# Patient Record
Sex: Female | Born: 2000 | Hispanic: No | Marital: Single | Smoking: Never smoker
Health system: Southern US, Community
[De-identification: ages and names within clinical notes are randomized; demographics above are authoritative.]

## PROBLEM LIST (undated history)

## (undated) DIAGNOSIS — R519 Headache, unspecified: Secondary | ICD-10-CM

## (undated) DIAGNOSIS — F32A Depression, unspecified: Secondary | ICD-10-CM

## (undated) DIAGNOSIS — J302 Other seasonal allergic rhinitis: Secondary | ICD-10-CM

## (undated) DIAGNOSIS — R51 Headache: Secondary | ICD-10-CM

---
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
## 2018-02-11 DIAGNOSIS — H5203 Hypermetropia, bilateral: Secondary | ICD-10-CM | POA: Diagnosis not present

## 2018-05-18 DIAGNOSIS — R0609 Other forms of dyspnea: Secondary | ICD-10-CM | POA: Diagnosis not present

## 2018-05-18 DIAGNOSIS — N946 Dysmenorrhea, unspecified: Secondary | ICD-10-CM | POA: Diagnosis not present

## 2018-05-18 DIAGNOSIS — R5383 Other fatigue: Secondary | ICD-10-CM | POA: Diagnosis not present

## 2018-05-18 DIAGNOSIS — N920 Excessive and frequent menstruation with regular cycle: Secondary | ICD-10-CM | POA: Diagnosis not present

## 2019-06-02 DIAGNOSIS — J4520 Mild intermittent asthma, uncomplicated: Secondary | ICD-10-CM | POA: Diagnosis not present

## 2019-06-02 DIAGNOSIS — Z23 Encounter for immunization: Secondary | ICD-10-CM | POA: Diagnosis not present

## 2019-06-02 DIAGNOSIS — Z0000 Encounter for general adult medical examination without abnormal findings: Secondary | ICD-10-CM | POA: Diagnosis not present

## 2019-06-02 DIAGNOSIS — Z789 Other specified health status: Secondary | ICD-10-CM | POA: Diagnosis not present

## 2019-06-13 DIAGNOSIS — Z20828 Contact with and (suspected) exposure to other viral communicable diseases: Secondary | ICD-10-CM | POA: Diagnosis not present

## 2019-06-13 DIAGNOSIS — R05 Cough: Secondary | ICD-10-CM | POA: Diagnosis not present

## 2019-06-27 DIAGNOSIS — Z111 Encounter for screening for respiratory tuberculosis: Secondary | ICD-10-CM | POA: Diagnosis not present

## 2019-06-27 DIAGNOSIS — Z23 Encounter for immunization: Secondary | ICD-10-CM | POA: Diagnosis not present

## 2019-07-07 DIAGNOSIS — F411 Generalized anxiety disorder: Secondary | ICD-10-CM | POA: Diagnosis not present

## 2019-08-04 DIAGNOSIS — F411 Generalized anxiety disorder: Secondary | ICD-10-CM | POA: Diagnosis not present

## 2019-08-26 DIAGNOSIS — Z03818 Encounter for observation for suspected exposure to other biological agents ruled out: Secondary | ICD-10-CM | POA: Diagnosis not present

## 2019-08-26 DIAGNOSIS — Z20822 Contact with and (suspected) exposure to covid-19: Secondary | ICD-10-CM | POA: Diagnosis not present

## 2019-09-12 DIAGNOSIS — R110 Nausea: Secondary | ICD-10-CM | POA: Diagnosis not present

## 2019-09-12 DIAGNOSIS — F411 Generalized anxiety disorder: Secondary | ICD-10-CM | POA: Diagnosis not present

## 2019-09-27 DIAGNOSIS — J029 Acute pharyngitis, unspecified: Secondary | ICD-10-CM | POA: Diagnosis not present

## 2019-09-27 DIAGNOSIS — Z7951 Long term (current) use of inhaled steroids: Secondary | ICD-10-CM | POA: Diagnosis not present

## 2019-09-27 DIAGNOSIS — Z79899 Other long term (current) drug therapy: Secondary | ICD-10-CM | POA: Diagnosis not present

## 2019-09-27 DIAGNOSIS — Z20822 Contact with and (suspected) exposure to covid-19: Secondary | ICD-10-CM | POA: Diagnosis not present

## 2019-11-01 DIAGNOSIS — R079 Chest pain, unspecified: Secondary | ICD-10-CM | POA: Diagnosis not present

## 2019-11-01 DIAGNOSIS — U071 COVID-19: Secondary | ICD-10-CM | POA: Diagnosis not present

## 2019-11-01 DIAGNOSIS — Z20828 Contact with and (suspected) exposure to other viral communicable diseases: Secondary | ICD-10-CM | POA: Diagnosis not present

## 2019-11-01 DIAGNOSIS — R5383 Other fatigue: Secondary | ICD-10-CM | POA: Diagnosis not present

## 2019-11-03 DIAGNOSIS — R0602 Shortness of breath: Secondary | ICD-10-CM | POA: Diagnosis not present

## 2019-11-03 DIAGNOSIS — R05 Cough: Secondary | ICD-10-CM | POA: Diagnosis not present

## 2019-11-03 DIAGNOSIS — Z79899 Other long term (current) drug therapy: Secondary | ICD-10-CM | POA: Diagnosis not present

## 2019-11-03 DIAGNOSIS — R079 Chest pain, unspecified: Secondary | ICD-10-CM | POA: Diagnosis not present

## 2019-11-03 DIAGNOSIS — U071 COVID-19: Secondary | ICD-10-CM | POA: Diagnosis not present

## 2019-11-03 DIAGNOSIS — R0789 Other chest pain: Secondary | ICD-10-CM | POA: Diagnosis not present

## 2019-11-10 DIAGNOSIS — N3000 Acute cystitis without hematuria: Secondary | ICD-10-CM | POA: Diagnosis not present

## 2019-11-16 DIAGNOSIS — F321 Major depressive disorder, single episode, moderate: Secondary | ICD-10-CM | POA: Diagnosis not present

## 2019-11-16 DIAGNOSIS — F411 Generalized anxiety disorder: Secondary | ICD-10-CM | POA: Diagnosis not present

## 2020-01-19 DIAGNOSIS — F411 Generalized anxiety disorder: Secondary | ICD-10-CM | POA: Diagnosis not present

## 2020-01-19 DIAGNOSIS — F321 Major depressive disorder, single episode, moderate: Secondary | ICD-10-CM | POA: Diagnosis not present

## 2020-02-16 DIAGNOSIS — Z6826 Body mass index (BMI) 26.0-26.9, adult: Secondary | ICD-10-CM | POA: Diagnosis not present

## 2020-02-16 DIAGNOSIS — Z113 Encounter for screening for infections with a predominantly sexual mode of transmission: Secondary | ICD-10-CM | POA: Diagnosis not present

## 2020-02-16 DIAGNOSIS — Z118 Encounter for screening for other infectious and parasitic diseases: Secondary | ICD-10-CM | POA: Diagnosis not present

## 2020-02-16 DIAGNOSIS — Z01419 Encounter for gynecological examination (general) (routine) without abnormal findings: Secondary | ICD-10-CM | POA: Diagnosis not present

## 2020-02-17 DIAGNOSIS — F411 Generalized anxiety disorder: Secondary | ICD-10-CM | POA: Diagnosis not present

## 2020-02-17 DIAGNOSIS — F321 Major depressive disorder, single episode, moderate: Secondary | ICD-10-CM | POA: Diagnosis not present

## 2020-03-09 DIAGNOSIS — F411 Generalized anxiety disorder: Secondary | ICD-10-CM | POA: Diagnosis not present

## 2020-03-09 DIAGNOSIS — F321 Major depressive disorder, single episode, moderate: Secondary | ICD-10-CM | POA: Diagnosis not present

## 2020-03-13 DIAGNOSIS — Z23 Encounter for immunization: Secondary | ICD-10-CM | POA: Diagnosis not present

## 2020-04-06 DIAGNOSIS — Z79899 Other long term (current) drug therapy: Secondary | ICD-10-CM | POA: Diagnosis not present

## 2020-04-06 DIAGNOSIS — F329 Major depressive disorder, single episode, unspecified: Secondary | ICD-10-CM | POA: Diagnosis not present

## 2020-04-06 DIAGNOSIS — F419 Anxiety disorder, unspecified: Secondary | ICD-10-CM | POA: Diagnosis not present

## 2020-04-06 DIAGNOSIS — F4321 Adjustment disorder with depressed mood: Secondary | ICD-10-CM | POA: Diagnosis not present

## 2020-04-06 DIAGNOSIS — F331 Major depressive disorder, recurrent, moderate: Secondary | ICD-10-CM | POA: Diagnosis not present

## 2020-04-06 DIAGNOSIS — R45851 Suicidal ideations: Secondary | ICD-10-CM | POA: Diagnosis not present

## 2020-04-16 DIAGNOSIS — F321 Major depressive disorder, single episode, moderate: Secondary | ICD-10-CM | POA: Diagnosis not present

## 2020-04-16 DIAGNOSIS — F411 Generalized anxiety disorder: Secondary | ICD-10-CM | POA: Diagnosis not present

## 2020-04-17 DIAGNOSIS — F331 Major depressive disorder, recurrent, moderate: Secondary | ICD-10-CM | POA: Diagnosis not present

## 2020-04-17 DIAGNOSIS — F411 Generalized anxiety disorder: Secondary | ICD-10-CM | POA: Diagnosis not present

## 2020-04-30 DIAGNOSIS — F321 Major depressive disorder, single episode, moderate: Secondary | ICD-10-CM | POA: Diagnosis not present

## 2020-04-30 DIAGNOSIS — F411 Generalized anxiety disorder: Secondary | ICD-10-CM | POA: Diagnosis not present

## 2020-05-07 DIAGNOSIS — Z03818 Encounter for observation for suspected exposure to other biological agents ruled out: Secondary | ICD-10-CM | POA: Diagnosis not present

## 2020-05-07 DIAGNOSIS — Z20822 Contact with and (suspected) exposure to covid-19: Secondary | ICD-10-CM | POA: Diagnosis not present

## 2020-05-08 DIAGNOSIS — Z1339 Encounter for screening examination for other mental health and behavioral disorders: Secondary | ICD-10-CM | POA: Diagnosis not present

## 2020-05-08 DIAGNOSIS — F331 Major depressive disorder, recurrent, moderate: Secondary | ICD-10-CM | POA: Diagnosis not present

## 2020-05-24 DIAGNOSIS — Z111 Encounter for screening for respiratory tuberculosis: Secondary | ICD-10-CM | POA: Diagnosis not present

## 2020-05-24 DIAGNOSIS — Z30011 Encounter for initial prescription of contraceptive pills: Secondary | ICD-10-CM | POA: Diagnosis not present

## 2020-05-28 DIAGNOSIS — F331 Major depressive disorder, recurrent, moderate: Secondary | ICD-10-CM | POA: Diagnosis not present

## 2020-05-29 DIAGNOSIS — F331 Major depressive disorder, recurrent, moderate: Secondary | ICD-10-CM | POA: Diagnosis not present

## 2021-02-17 ENCOUNTER — Other Ambulatory Visit: Payer: Self-pay

## 2021-02-17 ENCOUNTER — Emergency Department
Admission: EM | Admit: 2021-02-17 | Discharge: 2021-02-17 | Disposition: A | Discharge status: Home / Self Care | Payer: BC Managed Care – PPO | Source: Home / Self Care

## 2021-02-17 DIAGNOSIS — U071 COVID-19: Secondary | ICD-10-CM

## 2021-02-17 HISTORY — DX: Depression, unspecified: F32.A

## 2021-02-17 LAB — POC SARS CORONAVIRUS 2 AG -  ED: SARS Coronavirus 2 Ag: POSITIVE — AB

## 2021-02-17 NOTE — ED Provider Notes (Signed)
[facility]    CSN: [id number] Arrival date & time: 02/17/21  1436      History   Chief Complaint Chief Complaint  Patient presents with   Nasal Congestion   Cough    HPI Jo Baker is a 20 y.o. female.   HPI old female presents with cough and nasal congestion x 3 days including intermittent shortness of breath and chest tightness.  Patient reports is [redacted] weeks pregnant and has recently take it COVID-19 home test which is negative.  Patient is vaccinated for COVID-19.  Past Medical History:  Diagnosis Date   Depression    Headache    Seasonal allergies     There are no problems to display for this patient.   History reviewed. No pertinent surgical history.  OB History     Gravida  1   Para      Term      Preterm      AB      Living         SAB      IAB      Ectopic      Multiple      Live Births               Home Medications    Prior to Admission medications   Medication Sig Start Date End Date Taking? Authorizing Provider  prenatal vitamin w/FE, FA (PRENATAL 1 + 1) 27-1 MG TABS tablet Take 1 tablet by mouth daily at 12 noon.   Yes [provider]  sertraline (ZOLOFT) 25 MG tablet Take 25 mg by mouth daily.   Yes [provider]  amitriptyline (ELAVIL) 10 MG tablet Take 10 mg by mouth at bedtime.    [provider]  meloxicam (MOBIC) 7.5 MG tablet Take one tab by mouth each morning with breakfast. 09/22/14   Lattie Haw, MD  ondansetron (ZOFRAN ODT) 4 MG disintegrating tablet Take one tab by mouth Q6hr prn nausea.  Dissolve under tongue. 08/10/16   Lattie Haw, MD    Family History Family History  Problem Relation Age of Onset   Healthy Mother    Hypertension Father    Hypertension Sister     Social History Social History   Tobacco Use   Smoking status: Never   Smokeless tobacco: Never  Vaping Use   Vaping Use: Never used  Substance Use Topics   Alcohol use: No    Drug use: No     Allergies   Patient has no known allergies.   Review of Systems Review of Systems  HENT:  Positive for congestion.   Respiratory:  Positive for cough, chest tightness and shortness of breath.   All other systems reviewed and are negative.   Physical Exam Triage Vital Signs ED Triage Vitals  Enc Vitals Group     BP 02/17/21 1512 119/80     Pulse Rate 02/17/21 1512 (!) 108     Resp 02/17/21 1512 20     Temp 02/17/21 1512 98.4 F (36.9 C)     Temp Source 02/17/21 1512 Oral     SpO2 02/17/21 1512 98 %     Weight 02/17/21 1508 187 lb (84.8 kg)     Height 02/17/21 1508 5\' 6"  (1.676 m)     Head Circumference --      Peak Flow --      Pain Score 02/17/21 1507 4     Pain Loc --  Pain Edu? --      Excl. in GC? --    No data found.  Updated Vital Signs BP 119/80   Pulse (!) 108   Temp 98.4 F (36.9 C) (Oral)   Resp 20   Ht 5\' 6"  (1.676 m)   Wt 187 lb (84.8 kg)   SpO2 98%   BMI 30.18 kg/m    Physical Exam Constitutional:      General: She is not in acute distress.    Appearance: Normal appearance. She is normal weight. She is not ill-appearing.  HENT:     Head: Normocephalic and atraumatic.     Right Ear: Tympanic membrane, ear canal and external ear normal.     Left Ear: Tympanic membrane, ear canal and external ear normal.     Nose: Nose normal.     Mouth/Throat:     Mouth: Mucous membranes are dry.     Pharynx: Oropharynx is clear.  Eyes:     Extraocular Movements: Extraocular movements intact.     Conjunctiva/sclera: Conjunctivae normal.     Pupils: Pupils are equal, round, and reactive to light.  Cardiovascular:     Rate and Rhythm: Normal rate and regular rhythm.     Pulses: Normal pulses.     Heart sounds: Normal heart sounds.  Pulmonary:     Effort: Pulmonary effort is normal.     Breath sounds: Normal breath sounds.     Comments: No adventitious breath sounds noted Musculoskeletal:        General: Normal range of motion.      Cervical back: Normal range of motion and neck supple. No tenderness.  Lymphadenopathy:     Cervical: No cervical adenopathy.  Skin:    General: Skin is warm and dry.  Neurological:     General: No focal deficit present.     Mental Status: She is alert and oriented to person, place, and time.  Psychiatric:        Mood and Affect: Mood normal.        Behavior: Behavior normal.        Thought Content: Thought content normal.     UC Treatments / Results  Labs (all labs ordered are listed, but only abnormal results are displayed) Labs Reviewed  POC SARS CORONAVIRUS 2 AG -  ED - Abnormal; Notable for the following components:      Result Value   SARS Coronavirus 2 Ag Positive (*)    All other components within normal limits    EKG   Radiology No results found.  Procedures Procedures (including critical care time)  Medications Ordered in UC Medications - No data to display  Initial Impression / Assessment and Plan / UC Course  I have reviewed the triage vital signs and the nursing notes.  Pertinent labs & imaging results that were available during my care of the patient were reviewed by me and considered in my medical decision making (see chart for details).     MDM: 1.  COVID-19-advised patient conservative measures for now, may use OTC Tylenol 1000 mg 1-2 times daily, as needed or OTC ibuprofen 600 to 800 mg 1-2 times daily, as needed for fever and/or myalgias, encourage patient increase daily water intake and to self quarantine for the next 10 days or 02/27/2021.  Patient discharged home, hemodynamically stable Final Clinical Impressions(s) / UC Diagnoses   Final diagnoses:  COVID-19     Discharge Instructions      Advised patient may use  OTC Tylenol 1000 mg and/or OTC ibuprofen 600 to 800 mg daily, as needed for fever, myalgias.  Encourage patient increase daily water intake and to quarantine for 10 days or, 02/27/2021.     ED Prescriptions   None     PDMP not reviewed this encounter.   Trevor Iha, FNP 02/17/21 1711

## 2021-02-17 NOTE — ED Triage Notes (Signed)
Pt presents to Urgent Care with c/o cough and nasal congestion x 3 days. States she has intermittent sob and chest tightness. Denies radiating pain; does report some nausea, but she is [redacted] weeks pregnant. Pt states she took a home COVID test 2 days ago which was negative; pt vaccinated. Reports feeling a "little light-headed" today in waiting room.

## 2021-02-17 NOTE — Discharge Instructions (Addendum)
Advised patient may use OTC Tylenol 1000 mg and/or OTC ibuprofen 600 to 800 mg daily, as needed for fever, myalgias.  Encourage patient increase daily water intake and to quarantine for 10 days or, 02/27/2021.

## 2021-05-25 DIAGNOSIS — F430 Acute stress reaction: Secondary | ICD-10-CM | POA: Insufficient documentation

## 2021-08-07 DIAGNOSIS — G43909 Migraine, unspecified, not intractable, without status migrainosus: Secondary | ICD-10-CM | POA: Diagnosis not present

## 2021-08-07 DIAGNOSIS — R110 Nausea: Secondary | ICD-10-CM | POA: Diagnosis not present

## 2021-09-07 DIAGNOSIS — Z20822 Contact with and (suspected) exposure to covid-19: Secondary | ICD-10-CM | POA: Diagnosis not present

## 2021-09-07 DIAGNOSIS — J069 Acute upper respiratory infection, unspecified: Secondary | ICD-10-CM | POA: Diagnosis not present

## 2021-12-10 ENCOUNTER — Ambulatory Visit
Admission: RE | Admit: 2021-12-10 | Discharge: 2021-12-10 | Disposition: A | Discharge status: Home / Self Care | Payer: 59 | Source: Ambulatory Visit | Attending: Student | Admitting: Student

## 2021-12-10 VITALS — BP 114/80 | HR 89 | Temp 98.6°F | Resp 16

## 2021-12-10 DIAGNOSIS — S161XXA Strain of muscle, fascia and tendon at neck level, initial encounter: Secondary | ICD-10-CM

## 2021-12-10 MED ORDER — TIZANIDINE HCL 4 MG PO TABS: 4.0000 mg | ORAL_TABLET | Freq: Four times a day (QID) | ORAL | 0 refills | Status: DC | PRN

## 2021-12-10 NOTE — ED Provider Notes (Signed)
?[facility] ? ? ? ?CSN: [id number] ?Arrival date & time: 12/10/21  0940 ? ? ?  ? ?History   ?Chief Complaint ?Chief Complaint  ?Patient presents with  ? Neck Injury  ?  Entered by patient  ? ? ?HPI ?Karolee Ohsnna K Slingerland is a 21 y.o. female presenting with right-sided neck pain following turning over in bed.  This occurred about 2 hours ago.  Medical history noncontributory, denies prior history of back/neck issues or trauma.  Patient states that she was laying in bed, she turned over to look at her fianc? when the right side of her neck seized up and she heard a pop.  Has attempted 400 mg of ibuprofen for the pain with minimal improvement.  States it is tolerable at rest, but with movement it is excruciating 10/10.  Denies new radiation of pain or numbness down the arms of the legs, denies lower back pain, denies headaches, denies vision changes. ? ?HPI ? ?Past Medical History:  ?Diagnosis Date  ? Depression   ? Headache   ? Seasonal allergies   ? ? ?There are no problems to display for this patient. ? ? ?Past Surgical History:  ?Procedure Laterality Date  ? CESAREAN SECTION    ? ? ?OB History   ? ? Gravida  ?1  ? Para  ?   ? Term  ?   ? Preterm  ?   ? AB  ?   ? Living  ?   ?  ? ? SAB  ?   ? IAB  ?   ? Ectopic  ?   ? Multiple  ?   ? Live Births  ?   ?   ?  ?  ? ? ? ?Home Medications   ? ?Prior to Admission medications   ?Medication Sig Start Date End Date Taking? Authorizing Provider  ?tiZANidine (ZANAFLEX) 4 MG tablet Take 1 tablet (4 mg total) by mouth every 6 (six) hours as needed for muscle spasms. 12/10/21  Yes Rhys MartiniGraham, [place] E, PA-C  ?amitriptyline (ELAVIL) 10 MG tablet Take 10 mg by mouth at bedtime.    [provider]  ?meloxicam (MOBIC) 7.5 MG tablet Take one tab by mouth each morning with breakfast. 09/22/14   Lattie HawBeese, Stephen A, MD  ?ondansetron (ZOFRAN ODT) 4 MG disintegrating tablet Take one tab by mouth Q6hr prn nausea.  Dissolve under tongue. 08/10/16   Lattie HawBeese, Stephen A, MD  ?prenatal vitamin  w/FE, FA (PRENATAL 1 + 1) 27-1 MG TABS tablet Take 1 tablet by mouth daily at 12 noon.    [provider]  ?sertraline (ZOLOFT) 25 MG tablet Take 25 mg by mouth daily.    [provider]  ? ? ?Family History ?Family History  ?Problem Relation Age of Onset  ? Healthy Mother   ? Hypertension Father   ? Hypertension Sister   ? ? ?Social History ?Social History  ? ?Tobacco Use  ? Smoking status: Never  ? Smokeless tobacco: Never  ?Vaping Use  ? Vaping Use: Never used  ?Substance Use Topics  ? Alcohol use: No  ? Drug use: No  ? ? ? ?Allergies   ?Patient has no known allergies. ? ? ?Review of Systems ?Review of Systems  ?Musculoskeletal:  Positive for neck pain.  ?All other systems reviewed and are negative. ? ? ?Physical Exam ?Triage Vital Signs ?ED Triage Vitals  ?Enc Vitals Group  ?   BP 12/10/21 1005 114/80  ?   Pulse Rate 12/10/21  1005 89  ?   Resp 12/10/21 1005 16  ?   Temp 12/10/21 1005 98.6 ?F (37 ?C)  ?   Temp Source 12/10/21 1005 Oral  ?   SpO2 12/10/21 1005 96 %  ?   Weight --   ?   Height --   ?   Head Circumference --   ?   Peak Flow --   ?   Pain Score 12/10/21 1008 10  ?   Pain Loc --   ?   Pain Edu? --   ?   Excl. in GC? --   ? ?No data found. ? ?Updated Vital Signs ?BP 114/80 (BP Location: Left Arm)   Pulse 89   Temp 98.6 ?F (37 ?C) (Oral)   Resp 16   LMP 12/08/2021   SpO2 96%   Breastfeeding No  ? ?Visual Acuity ?Right Eye Distance:   ?Left Eye Distance:   ?Bilateral Distance:   ? ?Right Eye Near:   ?Left Eye Near:    ?Bilateral Near:    ? ?Physical Exam ?Vitals reviewed.  ?Constitutional:   ?   General: She is not in acute distress. ?   Appearance: Normal appearance. She is not ill-appearing.  ?HENT:  ?   Head: Normocephalic and atraumatic.  ?Neck:  ?   Comments: See MSK ?Cardiovascular:  ?   Rate and Rhythm: Normal rate and regular rhythm.  ?   Heart sounds: Normal heart sounds.  ?Pulmonary:  ?   Effort: Pulmonary effort is normal.  ?   Breath sounds: Normal breath sounds and  air entry.  ?Abdominal:  ?   Tenderness: There is no abdominal tenderness. There is no right CVA tenderness, left CVA tenderness, guarding or rebound.  ?Musculoskeletal:  ?   Cervical back: Normal range of motion. Spasms and tenderness present. No swelling, deformity, signs of trauma, rigidity, bony tenderness or crepitus. Pain with movement and muscular tenderness present.  ?   Thoracic back: No swelling, deformity, signs of trauma, spasms, tenderness or bony tenderness. Normal range of motion. No scoliosis.  ?   Lumbar back: No swelling, deformity, signs of trauma, spasms, tenderness or bony tenderness. Normal range of motion. Negative right straight leg raise test and negative left straight leg raise test. No scoliosis.  ?   Comments: Right-sided cervical paraspinous muscle tenderness and hypertonicity to palpation.  Pain elicited with flexion and extension cervical spine. No midline spinous tenderness, deformity, stepoff.  Strength grossly intact upper and lower extremities, sensation intact.  Negative Spurling bilaterally.  Grip strength 5/5. ? ?Absolutely no other injury, deformity, tenderness, ecchymosis, abrasion.  ?Neurological:  ?   General: No focal deficit present.  ?   Mental Status: She is alert.  ?   Cranial Nerves: No cranial nerve deficit.  ?Psychiatric:     ?   Mood and Affect: Mood normal.     ?   Behavior: Behavior normal.     ?   Thought Content: Thought content normal.     ?   Judgment: Judgment normal.  ? ? ? ?UC Treatments / Results  ?Labs ?(all labs ordered are listed, but only abnormal results are displayed) ?Labs Reviewed - No data to display ? ?EKG ? ? ?Radiology ?No results found. ? ?Procedures ?Procedures (including critical care time) ? ?Medications Ordered in UC ?Medications - No data to display ? ?Initial Impression / Assessment and Plan / UC Course  ?I have reviewed the triage vital signs and the nursing notes. ? ?  Pertinent labs & imaging results that were available during my care  of the patient were reviewed by me and considered in my medical decision making (see chart for details). ? ?  ? ?This patient is a very pleasant 21 y.o. year old female presenting with cervical strain following turning over in bed this morning. No red flag symptoms. As there is no midline spinous tenderness or deformity, and no radicular symptoms, did not check an xray.  ? ?Zanaflex, heating pad, continue ibuprofen. As she took 400mg  ibuprofen immediately before her visit, did not administer additional during visit.  ? ?Work note provided. ED return precautions discussed. Patient verbalizes understanding and agreement.  ? ? ?Final Clinical Impressions(s) / UC Diagnoses  ? ?Final diagnoses:  ?Strain of neck muscle, initial encounter  ? ? ? ?Discharge Instructions   ? ?  ?-Start the muscle relaxer-Zanaflex (tizanidine) as needed for muscle spasms and pain.  This can make you drowsy, so take at bedtime or when you do not need to drive or operate machinery. ?-You can take Tylenol up to 1000 mg 3 times daily, and ibuprofen up to 600 mg 3 times daily with food.  You can take these together, or alternate every 3-4 hours. ?-Heating pad ?-Gentle stretching  ? ? ? ?ED Prescriptions   ? ? Medication Sig Dispense Auth. Provider  ? tiZANidine (ZANAFLEX) 4 MG tablet Take 1 tablet (4 mg total) by mouth every 6 (six) hours as needed for muscle spasms. 30 tablet , PA-C  ? ?  ? ?PDMP not reviewed this encounter. ?  ?Rhys Martini, PA-C ?12/10/21 1107 ? ?

## 2021-12-10 NOTE — ED Triage Notes (Signed)
Patient presents to Urgent Care with complaints of neck pain this morning. She states she was turning her head and heard a pop sound. Treating pain with ibuprofen.   ?

## 2021-12-10 NOTE — Discharge Instructions (Addendum)
-  Start the muscle relaxer-Zanaflex (tizanidine) as needed for muscle spasms and pain.  This can make you drowsy, so take at bedtime or when you do not need to drive or operate machinery. ?-You can take Tylenol up to 1000 mg 3 times daily, and ibuprofen up to 600 mg 3 times daily with food.  You can take these together, or alternate every 3-4 hours. ?-Heating pad ?-Gentle stretching  ? ?

## 2021-12-28 ENCOUNTER — Other Ambulatory Visit: Payer: Self-pay

## 2021-12-28 ENCOUNTER — Emergency Department: Payer: 59

## 2021-12-28 ENCOUNTER — Encounter: Payer: Self-pay | Admitting: Emergency Medicine

## 2021-12-28 ENCOUNTER — Emergency Department
Admission: EM | Admit: 2021-12-28 | Discharge: 2021-12-28 | Disposition: A | Discharge status: Home / Self Care | Payer: 59 | Attending: Emergency Medicine | Admitting: Emergency Medicine

## 2021-12-28 DIAGNOSIS — R062 Wheezing: Secondary | ICD-10-CM | POA: Diagnosis present

## 2021-12-28 DIAGNOSIS — J4531 Mild persistent asthma with (acute) exacerbation: Secondary | ICD-10-CM | POA: Insufficient documentation

## 2021-12-28 MED ORDER — PREDNISONE 10 MG (21) PO TBPK: ORAL_TABLET | ORAL | 0 refills | Status: DC

## 2021-12-28 NOTE — ED Triage Notes (Signed)
  Patient comes in with asthma exacerbation.  Patient states she went to the gym around 1200 this morning and started having asthma attack before working out.  Patient took 3 puffs from albuterol inhaler and felt better.  Patient came home and had another asthma attack around 1900 while sitting on couch.  Patient again took 3 puffs from albuterol MDI and felt better but wanted to be checked out.  Patient endorses tightness under bilateral breasts and difficulty breathing.  Bilateral lung sounds clear and no wheezing.  SPO2 100% on RA and patient in no distress.  Pain 6/10.

## 2021-12-28 NOTE — Discharge Instructions (Signed)
Take tapered steroid if wheezing returns tomorrow.

## 2021-12-28 NOTE — ED Provider Notes (Signed)
[facility] Provider Note  Patient Contact: 9:44 PM (approximate)   History   Asthma   HPI  Jo Baker is a 21 y.o. female presents to the emergency department with intermittent wheezing that occurred today.  Patient states that for wheezing seemed to resolve.  Patient states that she was typically have an asthma exacerbation and wanted be checked out.  She denies current wheezing and denies current chest tightness or chest pain.      Physical Exam   Triage Vital Signs: ED Triage Vitals  Enc Vitals Group     BP 12/28/21 2031 134/85     Pulse Rate 12/28/21 2031 75     Resp 12/28/21 2031 18     Temp 12/28/21 2031 98.7 F (37.1 C)     Temp Source 12/28/21 2031 Oral     SpO2 12/28/21 2031 100 %     Weight 12/28/21 2032 200 lb (90.7 kg)     Height 12/28/21 2032 5\' 6"  (1.676 m)     Head Circumference --      Peak Flow --      Pain Score 12/28/21 2031 6     Pain Loc --      Pain Edu? --      Excl. in GC? --     Most recent vital signs: Vitals:   12/28/21 2031  BP: 134/85  Pulse: 75  Resp: 18  Temp: 98.7 F (37.1 C)  SpO2: 100%     General: Alert and in no acute distress. Eyes:  PERRL. EOMI. Head: No acute traumatic findings ENT:      Ears: Tms are pearly.       Nose: No congestion/rhinnorhea.      Mouth/Throat: Mucous membranes are moist.  Neck: No stridor. No cervical spine tenderness to palpation. Cardiovascular:  Good peripheral perfusion Respiratory: Normal respiratory effort without tachypnea or retractions. Lungs CTAB. Good air entry to the bases with no decreased or absent breath sounds. Gastrointestinal: Bowel sounds 4 quadrants. Soft and nontender to palpation. No guarding or rigidity. No palpable masses. No distention. No CVA tenderness. Musculoskeletal: Full range of motion to all extremities.  Neurologic:  No gross focal neurologic deficits are appreciated.  Skin:   No rash noted Other:   ED Results / Procedures  / Treatments   Labs (all labs ordered are listed, but only abnormal results are displayed) Labs Reviewed - No data to display      RADIOLOGY  I personally viewed and evaluated these images as part of my medical decision making, as well as reviewing the written report by the radiologist.  ED Provider Interpretation: I personally interpreted chest x-ray and there were no consolidations, opacities or infiltrates to suggest pneumonia.   PROCEDURES:  Critical Care performed: No  Procedures   MEDICATIONS ORDERED IN ED: Medications - No data to display   IMPRESSION / MDM / ASSESSMENT AND PLAN / ED COURSE  I reviewed the triage vital signs and the nursing notes.                             Assessment and plan Wheezing 21 year old female presents to the emergency department with intermittent episodes of wheezing that started today.  Vital signs are reassuring at triage.  On exam, patient was alert, active and nontoxic-appearing.  Chest x-ray is reassuring.  Will prescribe patient tapered prednisone to use if wheezing returns tomorrow.  Return precautions were  given to return with new or worsening symptoms.      FINAL CLINICAL IMPRESSION(S) / ED DIAGNOSES   Final diagnoses:  Mild persistent asthma with exacerbation     Rx / DC Orders   ED Discharge Orders          Ordered    predniSONE (STERAPRED UNI-PAK 21 TAB) 10 MG (21) TBPK tablet        12/28/21 2139             Note:  This document was prepared using Dragon voice recognition software and may include unintentional dictation errors.   Pia Mau [place], Cordelia Poche 12/28/21 2147    Minna Antis, MD 12/29/21 Windell Moment

## 2022-02-25 ENCOUNTER — Ambulatory Visit
Admission: RE | Admit: 2022-02-25 | Discharge: 2022-02-25 | Disposition: A | Discharge status: Home / Self Care | Payer: 59 | Source: Ambulatory Visit | Attending: Emergency Medicine | Admitting: Emergency Medicine

## 2022-02-25 VITALS — BP 112/75 | HR 74 | Temp 98.4°F | Resp 20

## 2022-02-25 DIAGNOSIS — R002 Palpitations: Secondary | ICD-10-CM | POA: Diagnosis not present

## 2022-02-25 DIAGNOSIS — R42 Dizziness and giddiness: Secondary | ICD-10-CM

## 2022-02-25 MED ORDER — MECLIZINE HCL 12.5 MG PO TABS: 12.5000 mg | ORAL_TABLET | Freq: Three times a day (TID) | ORAL | 0 refills | Status: DC | PRN

## 2022-02-25 NOTE — ED Triage Notes (Signed)
Pt here with intermittent dizziness when moving head a certain way over the last few months.

## 2022-02-25 NOTE — ED Provider Notes (Signed)
Renaldo Fiddler    CSN: [id number] Arrival date & time: 02/25/22  1457      History   Chief Complaint Chief Complaint  Patient presents with   Dizziness    Entered by patient    HPI Jo Baker is a 21 y.o. female.  Patient presents with intermittent episodes of "dizziness" for several months; worse for the past couple of weeks.  The dizziness feels like spinning and sometimes like the room is spinning.  It is worse when she is lying down, changes position, turns her head.  The episodes resolve spontaneously.  No alleviating factors identified.  She reports occasionally feeling heart palpitations also.  No current dizziness, chest pain, heart palpitations.  No other associated symptoms.  No focal weakness, headache, shortness of breath, chest pain, or other symptoms.  She has attempted treatment by increasing her water intake.  She has not seen her PCP for her symptoms.  She states she recently moved to the area and does not have a local PCP yet.  Her medical history includes asthma, migraine headaches, anxiety, depression, stress reaction, suicidal thoughts, anemia.  LMP: current.  She denies current pregnancy or breastfeeding.    The history is provided by the patient and medical records.    Past Medical History:  Diagnosis Date   Depression    Headache    Seasonal allergies     There are no problems to display for this patient.   Past Surgical History:  Procedure Laterality Date   CESAREAN SECTION      OB History     Gravida  1   Para      Term      Preterm      AB      Living         SAB      IAB      Ectopic      Multiple      Live Births               Home Medications    Prior to Admission medications   Medication Sig Start Date End Date Taking? Authorizing Provider  meclizine (ANTIVERT) 12.5 MG tablet Take 1 tablet (12.5 mg total) by mouth 3 (three) times daily as needed for dizziness. 02/25/22  Yes Mickie Bail, NP   amitriptyline (ELAVIL) 10 MG tablet Take 10 mg by mouth at bedtime.    [provider]  meloxicam (MOBIC) 7.5 MG tablet Take one tab by mouth each morning with breakfast. 09/22/14   Lattie Haw, MD  ondansetron (ZOFRAN ODT) 4 MG disintegrating tablet Take one tab by mouth Q6hr prn nausea.  Dissolve under tongue. 08/10/16   Lattie Haw, MD  predniSONE (STERAPRED UNI-PAK 21 TAB) 10 MG (21) TBPK tablet Take 6 tablets the first day, take 5 tablets the second day, take 4 tablets the third day, take 3 tablets the fourth day, take 2 tablets the fifth day, take 1 tablet the sixth day. 12/28/21   Orvil Feil, PA-C  prenatal vitamin w/FE, FA (PRENATAL 1 + 1) 27-1 MG TABS tablet Take 1 tablet by mouth daily at 12 noon.    [provider]  sertraline (ZOLOFT) 25 MG tablet Take 25 mg by mouth daily.    [provider]  tiZANidine (ZANAFLEX) 4 MG tablet Take 1 tablet (4 mg total) by mouth every 6 (six) hours as needed for muscle spasms. 12/10/21   Rhys Martini, PA-C  Family History Family History  Problem Relation Age of Onset   Healthy Mother    Hypertension Father    Hypertension Sister     Social History Social History   Tobacco Use   Smoking status: Never   Smokeless tobacco: Never  Vaping Use   Vaping Use: Never used  Substance Use Topics   Alcohol use: No   Drug use: No     Allergies   Patient has no known allergies.   Review of Systems Review of Systems  Constitutional:  Negative for chills and fever.  Respiratory:  Negative for cough and shortness of breath.   Cardiovascular:  Positive for palpitations. Negative for chest pain.  Gastrointestinal:  Negative for abdominal pain, nausea and vomiting.  Neurological:  Positive for dizziness and light-headedness. Negative for syncope, facial asymmetry, speech difficulty, weakness, numbness and headaches.  All other systems reviewed and are negative.    Physical Exam Triage Vital Signs ED  Triage Vitals  Enc Vitals Group     BP 02/25/22 1508 112/75     Pulse Rate 02/25/22 1508 74     Resp 02/25/22 1508 20     Temp 02/25/22 1508 98.4 F (36.9 C)     Temp src --      SpO2 02/25/22 1508 99 %     Weight --      Height --      Head Circumference --      Peak Flow --      Pain Score 02/25/22 1509 0     Pain Loc --      Pain Edu? --      Excl. in GC? --    No data found.  Updated Vital Signs BP 112/75   Pulse 74   Temp 98.4 F (36.9 C)   Resp 20   SpO2 99%   Visual Acuity Right Eye Distance:   Left Eye Distance:   Bilateral Distance:    Right Eye Near:   Left Eye Near:    Bilateral Near:     Physical Exam Vitals and nursing note reviewed.  Constitutional:      General: She is not in acute distress.    Appearance: Normal appearance. She is well-developed. She is not ill-appearing.  HENT:     Head: Normocephalic and atraumatic.     Mouth/Throat:     Mouth: Mucous membranes are moist.  Eyes:     Extraocular Movements: Extraocular movements intact.     Pupils: Pupils are equal, round, and reactive to light.  Cardiovascular:     Rate and Rhythm: Normal rate and regular rhythm.     Heart sounds: Normal heart sounds.  Pulmonary:     Effort: Pulmonary effort is normal. No respiratory distress.     Breath sounds: Normal breath sounds.  Abdominal:     Palpations: Abdomen is soft.     Tenderness: There is no abdominal tenderness.  Musculoskeletal:     Cervical back: Neck supple.  Skin:    General: Skin is warm and dry.  Neurological:     General: No focal deficit present.     Mental Status: She is alert and oriented to person, place, and time.     Cranial Nerves: No cranial nerve deficit.     Sensory: No sensory deficit.     Motor: No weakness.     Gait: Gait normal.  Psychiatric:        Mood and Affect: Mood normal.  Behavior: Behavior normal.      UC Treatments / Results  Labs (all labs ordered are listed, but only abnormal results  are displayed) Labs Reviewed - No data to display  EKG   Radiology No results found.  Procedures Procedures (including critical care time)  Medications Ordered in UC Medications - No data to display  Initial Impression / Assessment and Plan / UC Course  I have reviewed the triage vital signs and the nursing notes.  Pertinent labs & imaging results that were available during my care of the patient were reviewed by me and considered in my medical decision making (see chart for details).  Dizziness, heart palpitations.  Patient is well-appearing and her exam is reassuring.  Vital signs are stable.  EKG shows sinus rhythm, rate 80, no ST elevation, compared to previous from 12/31/2021.  Treating symptoms with meclizine and precautions for drowsiness with this medication discussed.  Education provided on dizziness and heart palpitations.  ED precautions discussed.  Instructed patient to establish a local PCP as soon as possible and [place] assistance with this requested.  Patient agrees to plan of care.  Final Clinical Impressions(s) / UC Diagnoses   Final diagnoses:  Dizziness  Heart palpitations     Discharge Instructions      Take the meclizine as directed.  Do not drive, operate machinery, or drink alcohol with this medication as it may cause drowsiness.   Go to the emergency department if you have worsening symptoms.    Establish a primary care provider as soon as possible.  [place] assistance with this has been requested.        ED Prescriptions     Medication Sig Dispense Auth. Provider   meclizine (ANTIVERT) 12.5 MG tablet Take 1 tablet (12.5 mg total) by mouth 3 (three) times daily as needed for dizziness. 30 tablet Mickie Bail, NP      I have reviewed the PDMP during this encounter.   Mickie Bail, NP 02/25/22 [phone]

## 2022-02-25 NOTE — Discharge Instructions (Addendum)
Take the meclizine as directed.  Do not drive, operate machinery, or drink alcohol with this medication as it may cause drowsiness.   Go to the emergency department if you have worsening symptoms.    Establish a primary care provider as soon as possible.  [place] assistance with this has been requested.

## 2022-02-27 ENCOUNTER — Encounter: Payer: Self-pay | Admitting: Emergency Medicine

## 2022-03-04 ENCOUNTER — Encounter: Payer: Self-pay | Admitting: Family Medicine

## 2022-03-18 ENCOUNTER — Ambulatory Visit: Payer: 59 | Admitting: Family Medicine

## 2022-11-13 DIAGNOSIS — G43809 Other migraine, not intractable, without status migrainosus: Secondary | ICD-10-CM | POA: Diagnosis not present

## 2023-02-26 ENCOUNTER — Encounter: Payer: Self-pay | Admitting: Family Medicine

## 2023-02-26 ENCOUNTER — Ambulatory Visit: Payer: Commercial Managed Care - PPO | Admitting: Family Medicine

## 2023-02-26 VITALS — BP 91/65 | HR 98 | Temp 98.1°F | Resp 20 | Ht 66.0 in | Wt 193.5 lb

## 2023-02-26 DIAGNOSIS — Z13220 Encounter for screening for lipoid disorders: Secondary | ICD-10-CM | POA: Diagnosis not present

## 2023-02-26 DIAGNOSIS — Z136 Encounter for screening for cardiovascular disorders: Secondary | ICD-10-CM | POA: Diagnosis not present

## 2023-02-26 DIAGNOSIS — Z1159 Encounter for screening for other viral diseases: Secondary | ICD-10-CM | POA: Diagnosis not present

## 2023-02-26 DIAGNOSIS — F411 Generalized anxiety disorder: Secondary | ICD-10-CM | POA: Diagnosis not present

## 2023-02-26 DIAGNOSIS — F331 Major depressive disorder, recurrent, moderate: Secondary | ICD-10-CM | POA: Diagnosis not present

## 2023-02-26 DIAGNOSIS — Z0000 Encounter for general adult medical examination without abnormal findings: Secondary | ICD-10-CM

## 2023-02-26 DIAGNOSIS — R7302 Impaired glucose tolerance (oral): Secondary | ICD-10-CM | POA: Diagnosis not present

## 2023-02-26 DIAGNOSIS — Z7689 Persons encountering health services in other specified circumstances: Secondary | ICD-10-CM

## 2023-02-26 NOTE — Progress Notes (Signed)
Complete physical exam and establish care  Patient: Jo Baker   DOB: [birth year]   22 y.o. Female  MRN: [id number]  Subjective:    Chief Complaint  Patient presents with   Establish Care    Patient is here to establish care with new PCP, She would like a physical without a pap smear, patient states her last pap was done two years ago at [facility] Jo Baker is a 22 y.o. female who presents today for a complete physical exam. She reports consuming a general diet.  Walking during work and goes to the gym once a week  She generally feels well. She reports sleeping well. She does not have additional problems to discuss today.   Last pap at [facility] in 2021.  She takes Wellbutrin 300mg  daily for anxiety/depression. She has psychiatrist in [place]. She is also taking hydroxyzine 25mg  po TID prn for anxiety.  Pt declines HPV vaccine.  Most recent fall risk assessment:    02/26/2023    1:26 PM  Fall Risk   Falls in the past year? 0  Number falls in past yr: 0  Injury with Fall? 0  Risk for fall due to : No Fall Risks  Follow up Falls evaluation completed     Most recent depression screenings:    02/26/2023    1:26 PM  PHQ 2/9 Scores  PHQ - 2 Score 0  PHQ- 9 Score 3    Vision:Within last year  Patient Active Problem List   Diagnosis Date Noted   GAD (generalized anxiety disorder) 04/17/2020   Moderate episode of recurrent major depressive disorder (HCC) 04/17/2020   Chronic non-seasonal allergic rhinitis 10/21/2016   Mild intermittent asthma without complication 10/21/2016   Past Medical History:  Diagnosis Date   Depression    Headache    Seasonal allergies    Past Surgical History:  Procedure Laterality Date   CESAREAN SECTION  2022   Social History   Tobacco Use   Smoking status: Never    Passive exposure: Never   Smokeless tobacco: Never  Vaping Use   Vaping status: Never Used  Substance Use Topics   Alcohol use: Never   Drug use:  Never   Social History   Socioeconomic History   Marital status: Single    Spouse name: Not on file   Number of children: 2   Years of education: Not on file   Highest education level: Not on file  Occupational History   Occupation: Conservation officer, historic buildings  Tobacco Use   Smoking status: Never    Passive exposure: Never   Smokeless tobacco: Never  Vaping Use   Vaping status: Never Used  Substance and Sexual Activity   Alcohol use: Never   Drug use: Never   Sexual activity: Not on file  Other Topics Concern   Not on file  Social History Narrative   Not on file   Social Determinants of Health   Financial Resource Strain: Not on file  Food Insecurity: No Food Insecurity (05/23/2021)   Received from [facility], [facility]   Hunger Vital Sign    Worried About Running Out of Food in the Last Year: Never true    Ran Out of Food in the Last Year: Never true  Transportation Needs: Not on file  Physical Activity: Not on file  Stress: Not on file  Social Connections: Unknown (12/14/2021)   Received from [facility], [facility]   Social  Network    Social Network: Not on file  Intimate Partner Violence: Unknown (11/06/2021)   Received from [facility], [facility]   HITS    Physically Hurt: Not on file    Insult or Talk Down To: Not on file    Threaten Physical Harm: Not on file    Scream or Curse: Not on file   Family Status  Relation Name Status   Mother  Alive   Father  Alive   Sister  (Not Specified)  No partnership data on file   Family History  Problem Relation Age of Onset   Healthy Mother    Hypertension Father    Hypertension Sister    No Known Allergies    Patient Care Team: [facility] as PCP - General   Outpatient Medications Prior to Visit  Medication Sig   buPROPion (WELLBUTRIN XL) 300 MG 24 hr tablet Take 300 mg by mouth daily.   hydrOXYzine (ATARAX) 25 MG tablet Take 25 mg by mouth every 8 (eight) hours as  needed for anxiety.   [DISCONTINUED] amitriptyline (ELAVIL) 10 MG tablet Take 10 mg by mouth at bedtime. (Patient not taking: Reported on 02/26/2023)   [DISCONTINUED] meclizine (ANTIVERT) 12.5 MG tablet Take 1 tablet (12.5 mg total) by mouth 3 (three) times daily as needed for dizziness. (Patient not taking: Reported on 02/26/2023)   [DISCONTINUED] meloxicam (MOBIC) 7.5 MG tablet Take one tab by mouth each morning with breakfast. (Patient not taking: Reported on 02/26/2023)   [DISCONTINUED] ondansetron (ZOFRAN ODT) 4 MG disintegrating tablet Take one tab by mouth Q6hr prn nausea.  Dissolve under tongue. (Patient not taking: Reported on 02/26/2023)   [DISCONTINUED] predniSONE (STERAPRED UNI-PAK 21 TAB) 10 MG (21) TBPK tablet Take 6 tablets the first day, take 5 tablets the second day, take 4 tablets the third day, take 3 tablets the fourth day, take 2 tablets the fifth day, take 1 tablet the sixth day.   [DISCONTINUED] prenatal vitamin w/FE, FA (PRENATAL 1 + 1) 27-1 MG TABS tablet Take 1 tablet by mouth daily at 12 noon.   [DISCONTINUED] sertraline (ZOLOFT) 25 MG tablet Take 25 mg by mouth daily. (Patient not taking: Reported on 02/26/2023)   [DISCONTINUED] tiZANidine (ZANAFLEX) 4 MG tablet Take 1 tablet (4 mg total) by mouth every 6 (six) hours as needed for muscle spasms. (Patient not taking: Reported on 02/26/2023)   No facility-administered medications prior to visit.    Review of Systems  All other systems reviewed and are negative.         Objective:     BP 91/65   Pulse 98   Temp 98.1 F (36.7 C) (Oral)   Resp 20   Ht 5\' 6"  (1.676 m)   Wt 193 lb 8 oz (87.8 kg)   LMP 02/23/2023 (Approximate)   SpO2 98%   BMI 31.23 kg/m  BP Readings from Last 3 Encounters:  02/26/23 91/65  02/25/22 112/75  12/28/21 129/79      Physical Exam Vitals and nursing note reviewed.  Constitutional:      Appearance: Normal appearance. She is normal weight.  HENT:     Head: Normocephalic and  atraumatic.     Right Ear: Tympanic membrane, ear canal and external ear normal.     Left Ear: Tympanic membrane, ear canal and external ear normal.     Nose: Nose normal.     Mouth/Throat:     Mouth: Mucous membranes are moist.     Pharynx: Oropharynx  is clear.  Eyes:     Conjunctiva/sclera: Conjunctivae normal.     Pupils: Pupils are equal, round, and reactive to light.  Cardiovascular:     Rate and Rhythm: Normal rate and regular rhythm.     Pulses: Normal pulses.     Heart sounds: Normal heart sounds.  Pulmonary:     Effort: Pulmonary effort is normal.     Breath sounds: Normal breath sounds.  Abdominal:     General: Abdomen is flat. Bowel sounds are normal.  Musculoskeletal:        General: Normal range of motion.  Skin:    General: Skin is warm.     Capillary Refill: Capillary refill takes less than 2 seconds.  Neurological:     General: No focal deficit present.     Mental Status: She is alert and oriented to person, place, and time. Mental status is at baseline.  Psychiatric:        Mood and Affect: Mood normal.        Behavior: Behavior normal.        Thought Content: Thought content normal.        Judgment: Judgment normal.      No results found for any visits on 02/26/23.      Assessment & Plan:    Routine Health Maintenance and Physical Exam  Immunization History  Administered Date(s) Administered   DTaP 11/06/1998, 01/25/1999, 03/27/1999, 01/22/2000, 05/01/2004   HIB, Unspecified 11/06/1998, 01/25/1999, 03/27/1999, 01/22/2000   Hepatitis B, PED/ADOLESCENT 03/27/1999, 07/24/1999, 10/17/1999   IPV 11/06/1998, 01/25/1999, 07/24/1999, 05/01/2004   Influenza Inj Mdck Quad Pf 04/09/2021   Influenza,inj,Quad PF,6+ Mos 06/02/2019   MMR 10/17/1999, 05/01/2004   Meningococcal Conjugate 12/09/2017   Pneumococcal Conjugate PCV 7 01/25/1999, 03/27/1999, 07/24/1999, 01/22/2000   Pneumococcal Polysaccharide-23 12/09/2017   Tdap 03/18/2010, 03/13/2020   Varicella  10/17/1999, 06/27/2019    Health Maintenance  Topic Date Due   COVID-19 Vaccine (1) Never done   HIV Screening  Never done   Hepatitis C Screening  Never done   PAP-Cervical Cytology Screening  08/04/2023 (Originally 09/03/2019)   PAP SMEAR-Modifier  08/04/2023 (Originally 09/03/2019)   HPV VACCINES (1 - 3-dose series) 02/26/2024 (Originally 09/02/2013)   INFLUENZA VACCINE  03/05/2023   DTaP/Tdap/Td (8 - Td or Tdap) 03/13/2030    Discussed health benefits of physical activity, and encouraged her to engage in regular exercise appropriate for her age and condition.  Problem List Items Addressed This Visit       Other   GAD (generalized anxiety disorder)   Relevant Medications   buPROPion (WELLBUTRIN XL) 300 MG 24 hr tablet   hydrOXYzine (ATARAX) 25 MG tablet   Moderate episode of recurrent major depressive disorder (HCC)   Relevant Medications   buPROPion (WELLBUTRIN XL) 300 MG 24 hr tablet   hydrOXYzine (ATARAX) 25 MG tablet   Other Visit Diagnoses     Annual physical exam    -  Primary   Encounter to establish care with new doctor       Encounter for lipid screening for cardiovascular disease       Relevant Orders   Lipid panel   Impaired glucose tolerance       Relevant Orders   CBC with Differential/Platelet   Comprehensive metabolic panel   Hemoglobin A1c   Screening for viral disease       Relevant Orders   Hepatitis C antibody   HIV Antibody (routine testing w rflx)  Return in about 1 year (around 02/26/2024) for Annual Physical. Screening labs Continue follow up with mental health for depression/anxiety See in 1 year sooner prn.    Suzan Slick, MD

## 2023-02-27 ENCOUNTER — Encounter: Payer: Self-pay | Admitting: Family Medicine

## 2023-02-27 LAB — CBC WITH DIFFERENTIAL/PLATELET
Neutrophils: 65 %
WBC: 11.5 10*3/uL — ABNORMAL HIGH (ref 3.4–10.8)

## 2023-02-27 LAB — HEMOGLOBIN A1C: Est. average glucose Bld gHb Est-mCnc: 114 mg/dL

## 2023-03-07 IMAGING — CR DG CHEST 2V
1 series · 2 of 2 positions shown · non-contrast
Comparison: 09/19/2014

CLINICAL DATA: Exacerbation of asthma

EXAM:
CHEST - 2 VIEW

[Series 1: dg chest 2 view · 0.14mm/px · 2 of 2 slices shown]
[im 1/2]
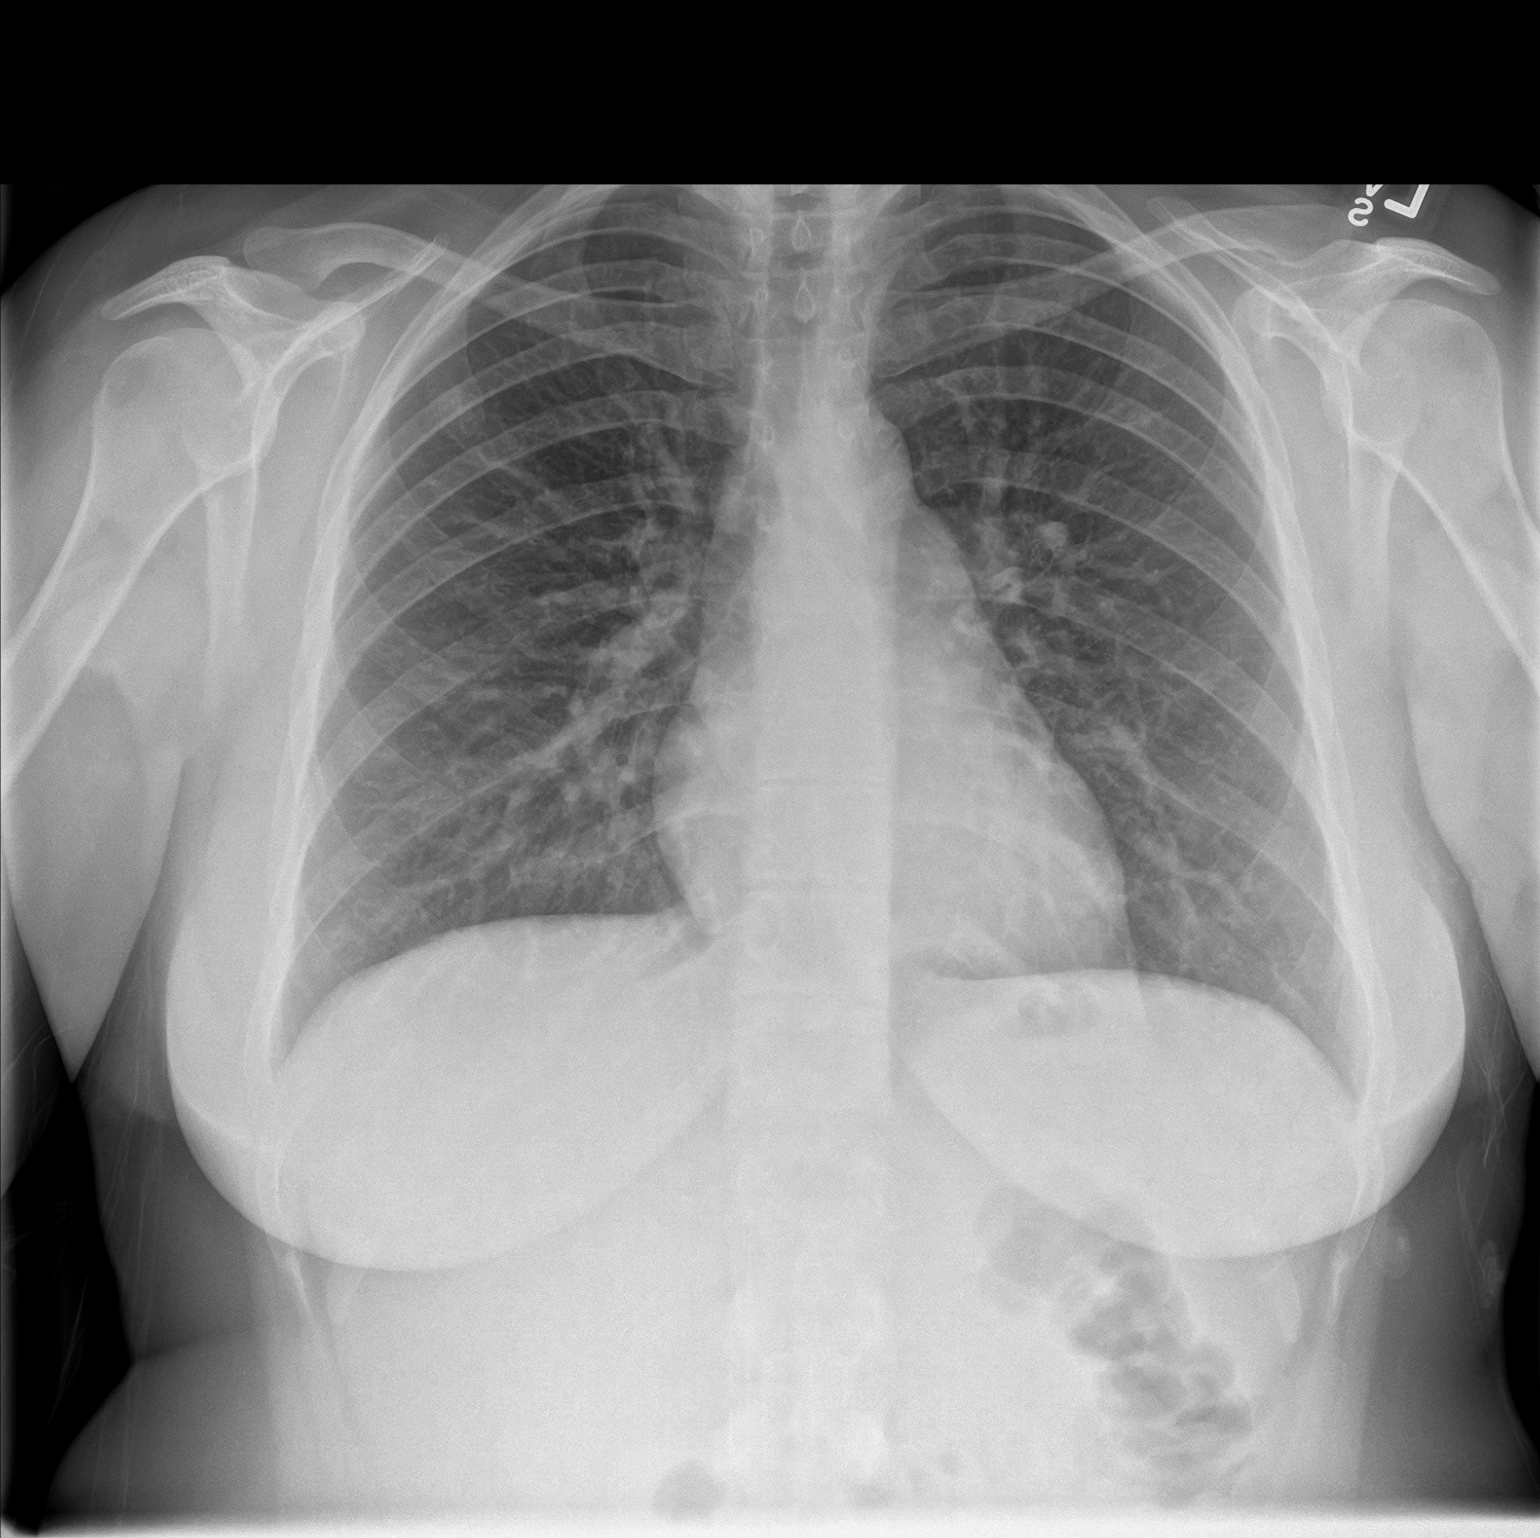
[im 2/2]
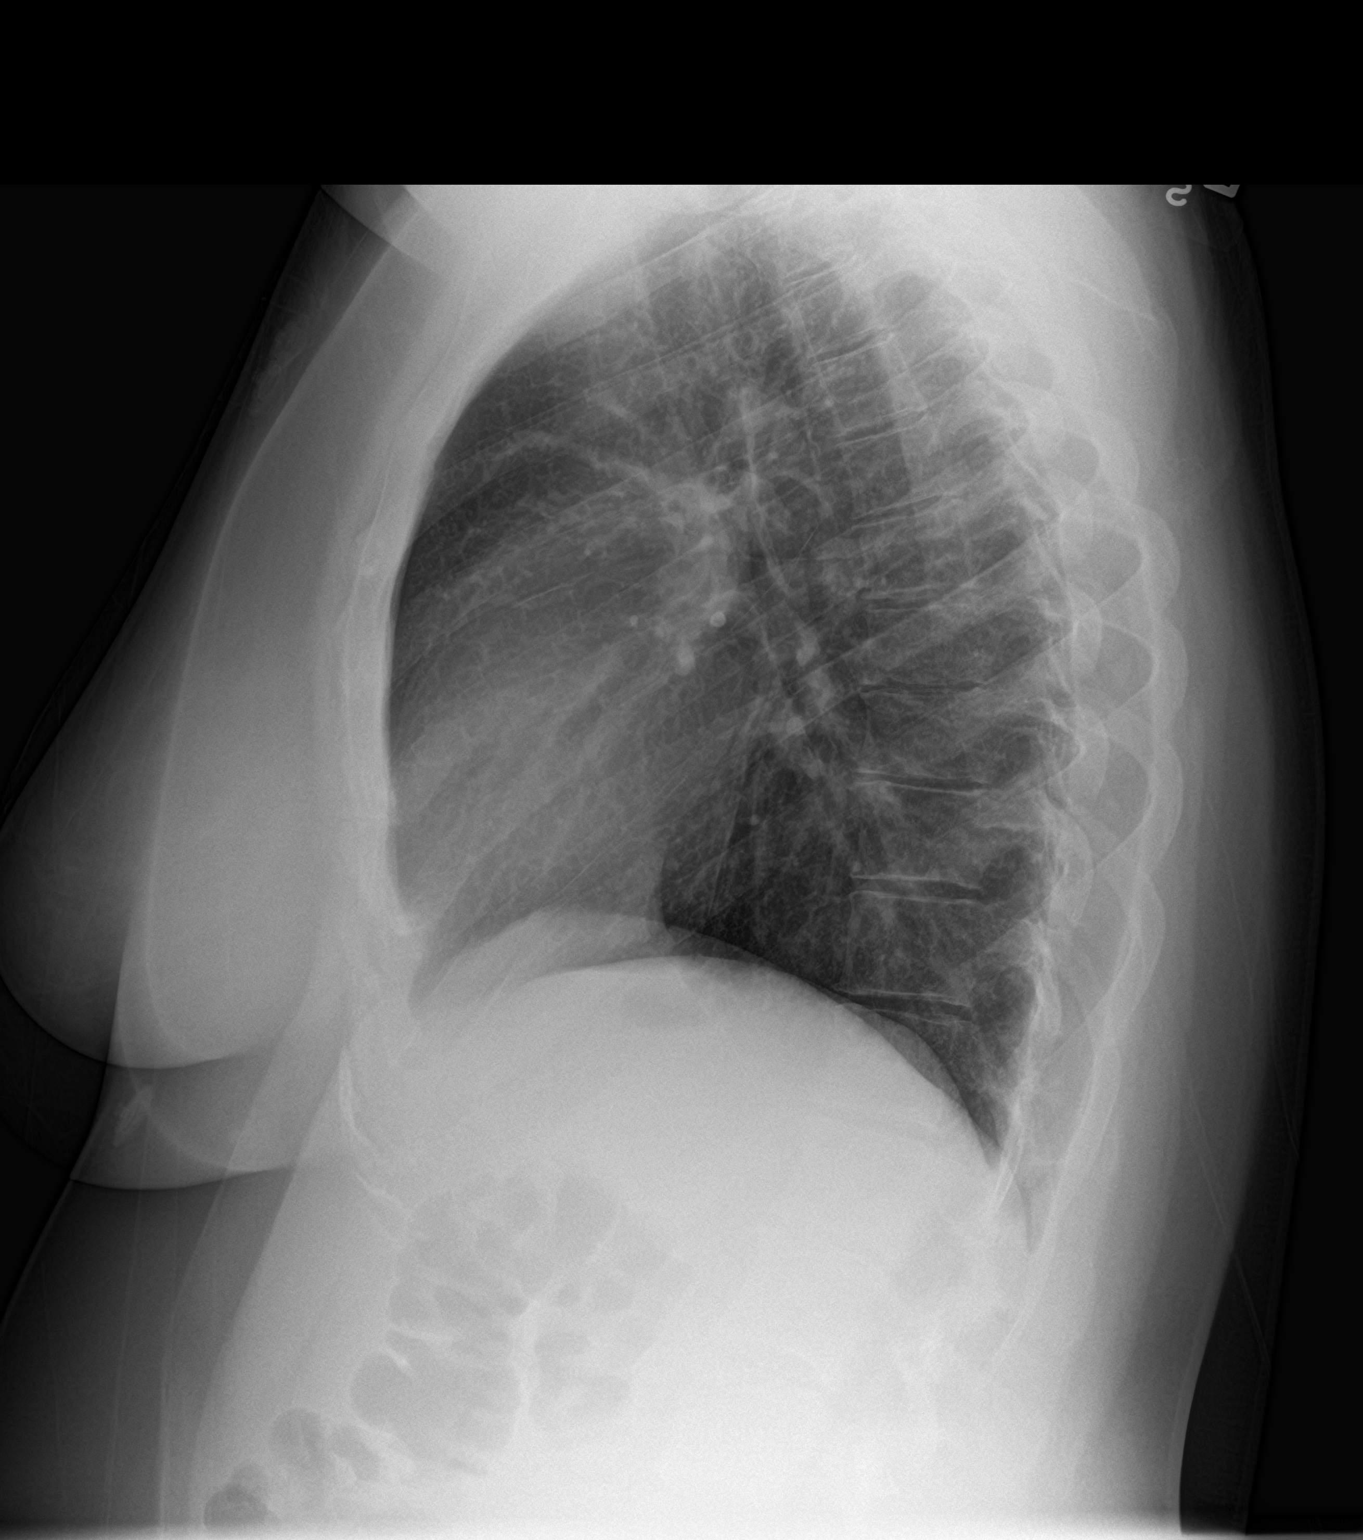

[2 of 2 positions shown; findings below may reference images not displayed]

FINDINGS: The heart size and mediastinal contours are within normal limits.
Both lungs are clear. The visualized skeletal structures are
unremarkable.
IMPRESSION: No active cardiopulmonary disease.

## 2023-06-01 ENCOUNTER — Ambulatory Visit: Payer: Commercial Managed Care - PPO | Admitting: Family Medicine

## 2023-06-01 ENCOUNTER — Other Ambulatory Visit: Payer: Self-pay | Admitting: Family Medicine

## 2023-06-01 ENCOUNTER — Encounter: Payer: Self-pay | Admitting: Family Medicine

## 2023-06-01 VITALS — BP 111/71 | HR 76 | Temp 98.2°F | Resp 18 | Ht 66.0 in | Wt 182.7 lb

## 2023-06-01 DIAGNOSIS — R1084 Generalized abdominal pain: Secondary | ICD-10-CM

## 2023-06-01 DIAGNOSIS — R197 Diarrhea, unspecified: Secondary | ICD-10-CM

## 2023-06-01 MED ORDER — ONDANSETRON 4 MG PO TBDP: 4.0000 mg | ORAL_TABLET | Freq: Three times a day (TID) | ORAL | 0 refills | Status: DC | PRN

## 2023-06-01 MED ORDER — FAMOTIDINE 20 MG PO TABS: 20.0000 mg | ORAL_TABLET | Freq: Two times a day (BID) | ORAL | 1 refills | Status: DC

## 2023-06-01 NOTE — Progress Notes (Addendum)
Acute Office Visit  Subjective:     Patient ID: Jo Baker, female    DOB: 2001/05/04, 22 y.o.   MRN: [id number]  Chief Complaint  Patient presents with   Abdominal Pain    Patient states that for the last two weeks she had been having upper abdominal pain, she states that she has a burning sensation in her stomach with extreme nausea and diarrhea. She states that these symptoms are of and on.    HPI Patient is in today for acute visit.  Abdominal pain She reports 2 weeks ago, she's been having burning sensation in her stomach. It happens when she first gets up in the morning or at nighttime. It will come and go. She also reports some loose stools along with nausea. No blood in the stool. No pain with defecation. She reports she isn't eating as much during this time. She denies chills or sweats. She reports sometimes she gets GERD every once in a while but not to this extend. No belching nor does she have tobacco or alcohol use. She has tried Omeprazole, zofran and Tums but the Zofran helps the nausea but nothing else.    Review of Systems  Constitutional:  Negative for chills and fever.  Gastrointestinal:  Positive for abdominal pain, diarrhea, heartburn and nausea. Negative for blood in stool, constipation, melena and vomiting.  All other systems reviewed and are negative.      Objective:    There were no vitals taken for this visit.   Physical Exam Vitals and nursing note reviewed.  Constitutional:      Appearance: Normal appearance. She is normal weight.  HENT:     Head: Normocephalic and atraumatic.     Right Ear: External ear normal.     Left Ear: External ear normal.     Nose: Nose normal.     Mouth/Throat:     Mouth: Mucous membranes are moist.     Pharynx: Oropharynx is clear.  Eyes:     Conjunctiva/sclera: Conjunctivae normal.     Pupils: Pupils are equal, round, and reactive to light.  Cardiovascular:     Rate and Rhythm: Normal rate and regular  rhythm.     Pulses: Normal pulses.     Heart sounds: Normal heart sounds.  Pulmonary:     Effort: Pulmonary effort is normal.     Breath sounds: Normal breath sounds.  Abdominal:     General: Abdomen is flat. Bowel sounds are normal.  Skin:    General: Skin is warm.     Capillary Refill: Capillary refill takes less than 2 seconds.  Neurological:     General: No focal deficit present.     Mental Status: She is alert and oriented to person, place, and time. Mental status is at baseline.  Psychiatric:        Mood and Affect: Mood normal.        Behavior: Behavior normal.        Thought Content: Thought content normal.        Judgment: Judgment normal.   No results found for any visits on 06/01/23.      Assessment & Plan:   Problem List Items Addressed This Visit   None Generalized abdominal pain -     CBC with Differential/Platelet -     Sedimentation rate -     C-reactive protein -     Famotidine; Take 1 tablet (20 mg total) by mouth 2 (two) times daily.  Dispense: 60 tablet; Refill: 1 -     Ondansetron; Take 1 tablet (4 mg total) by mouth every 8 (eight) hours as needed for nausea.  Dispense: 21 tablet; Refill: 0  Diarrhea, unspecified type -     CBC with Differential/Platelet -     Sedimentation rate -     C-reactive protein   Non-specific abdominal pain with burning sensation and diarrhea; suspect gastritis. Been present for 2 weeks. Will also add inflammatory markers to rule out other causes such as Inflammatory bowel disease. Will treat with Pepcid 20mg  BID for the next few days pending lab results. Refilled zofran 4mg  TID prn to use for nausea.  No orders of the defined types were placed in this encounter.   No follow-ups on file.  Suzan Slick, MD

## 2023-06-02 ENCOUNTER — Other Ambulatory Visit: Payer: Self-pay | Admitting: Family Medicine

## 2023-06-02 DIAGNOSIS — R1084 Generalized abdominal pain: Secondary | ICD-10-CM

## 2023-06-02 LAB — CBC WITH DIFFERENTIAL/PLATELET
Basophils Absolute: 0.1 10*3/uL (ref 0.0–0.2)
Basos: 1 %
EOS (ABSOLUTE): 0.3 10*3/uL (ref 0.0–0.4)
Eos: 4 %
Hematocrit: 43.5 % (ref 34.0–46.6)
Hemoglobin: 14 g/dL (ref 11.1–15.9)
Immature Grans (Abs): 0 10*3/uL (ref 0.0–0.1)
Immature Granulocytes: 0 %
Lymphocytes Absolute: 2.9 10*3/uL (ref 0.7–3.1)
Lymphs: 38 %
MCH: 27.6 pg (ref 26.6–33.0)
MCHC: 32.2 g/dL (ref 31.5–35.7)
MCV: 86 fL (ref 79–97)
Monocytes Absolute: 0.4 10*3/uL (ref 0.1–0.9)
Monocytes: 6 %
Neutrophils Absolute: 3.9 10*3/uL (ref 1.4–7.0)
Neutrophils: 51 %
Platelets: 406 10*3/uL (ref 150–450)
RBC: 5.08 x10E6/uL (ref 3.77–5.28)
RDW: 13.1 % (ref 11.7–15.4)
WBC: 7.6 10*3/uL (ref 3.4–10.8)

## 2023-06-02 LAB — SEDIMENTATION RATE: Sed Rate: 3 mm/h (ref 0–32)

## 2023-06-02 LAB — C-REACTIVE PROTEIN: CRP: 3 mg/L (ref 0–10)

## 2023-06-16 ENCOUNTER — Encounter: Payer: Self-pay | Admitting: Family Medicine

## 2023-06-16 MED ORDER — ALBUTEROL SULFATE HFA 108 (90 BASE) MCG/ACT IN AERS: 2.0000 | INHALATION_SPRAY | Freq: Four times a day (QID) | RESPIRATORY_TRACT | 0 refills | Status: AC | PRN

## 2023-06-18 ENCOUNTER — Ambulatory Visit
Admission: EM | Admit: 2023-06-18 | Discharge: 2023-06-18 | Disposition: A | Discharge status: Home / Self Care | Payer: Commercial Managed Care - PPO | Attending: Emergency Medicine | Admitting: Emergency Medicine

## 2023-06-18 DIAGNOSIS — G43011 Migraine without aura, intractable, with status migrainosus: Secondary | ICD-10-CM

## 2023-06-18 DIAGNOSIS — E162 Hypoglycemia, unspecified: Secondary | ICD-10-CM | POA: Insufficient documentation

## 2023-06-18 DIAGNOSIS — O3663X0 Maternal care for excessive fetal growth, third trimester, not applicable or unspecified: Secondary | ICD-10-CM | POA: Insufficient documentation

## 2023-06-18 DIAGNOSIS — O9902 Anemia complicating childbirth: Secondary | ICD-10-CM | POA: Insufficient documentation

## 2023-06-18 DIAGNOSIS — O99354 Diseases of the nervous system complicating childbirth: Secondary | ICD-10-CM | POA: Insufficient documentation

## 2023-06-18 DIAGNOSIS — O9962 Diseases of the digestive system complicating childbirth: Secondary | ICD-10-CM | POA: Insufficient documentation

## 2023-06-18 DIAGNOSIS — K219 Gastro-esophageal reflux disease without esophagitis: Secondary | ICD-10-CM | POA: Insufficient documentation

## 2023-06-18 DIAGNOSIS — Z3A37 37 weeks gestation of pregnancy: Secondary | ICD-10-CM | POA: Insufficient documentation

## 2023-06-18 DIAGNOSIS — G43909 Migraine, unspecified, not intractable, without status migrainosus: Secondary | ICD-10-CM | POA: Insufficient documentation

## 2023-06-18 DIAGNOSIS — K831 Obstruction of bile duct: Secondary | ICD-10-CM | POA: Insufficient documentation

## 2023-06-18 DIAGNOSIS — Z7982 Long term (current) use of aspirin: Secondary | ICD-10-CM | POA: Insufficient documentation

## 2023-06-18 MED ORDER — KETOROLAC TROMETHAMINE 30 MG/ML IJ SOLN
30.0000 mg | Freq: Once | INTRAMUSCULAR | Status: AC
  Administered 2023-06-18: 30 mg via INTRAMUSCULAR

## 2023-06-18 MED ORDER — SUMATRIPTAN SUCCINATE 6 MG/0.5ML ~~LOC~~ SOLN
6.0000 mg | Freq: Once | SUBCUTANEOUS | Status: AC
  Administered 2023-06-18: 6 mg via SUBCUTANEOUS

## 2023-06-18 MED ORDER — METOCLOPRAMIDE HCL 5 MG/ML IJ SOLN
10.0000 mg | Freq: Once | INTRAMUSCULAR | Status: AC
  Administered 2023-06-18: 10 mg via INTRAMUSCULAR

## 2023-06-18 MED ORDER — DEXAMETHASONE SODIUM PHOSPHATE 10 MG/ML IJ SOLN
10.0000 mg | Freq: Once | INTRAMUSCULAR | Status: AC
  Administered 2023-06-18: 10 mg via INTRAMUSCULAR

## 2023-06-18 NOTE — ED Triage Notes (Signed)
Patient presents to [facility] for migraine since last night. Did not take anything for pain. Took zofran for nausea/vomiting.

## 2023-06-18 NOTE — ED Provider Notes (Addendum)
Renaldo Fiddler    CSN: [id number] Arrival date & time: 06/18/23  0818      History   Chief Complaint Chief Complaint  Patient presents with   Migraine    HPI Jo Baker is a 22 y.o. female.   Patient presents for evaluation of a constant generalized headache beginning yesterday evening.  Described as dull and throbbing.  Associated light sensitivity, noise sensitivity, nausea with vomiting and intermittent dizziness.  Has attempted use of Zofran and Tylenol which has been ineffective.  No known trigger.  Currently has a viral illness which has been improving.  Denies diet or medication changes, recent travel.  Denies visual disturbance, lightheadedness, syncope or head injury.  History of migraines.  Past Medical History:  Diagnosis Date   Depression    Headache    Seasonal allergies     Patient Active Problem List   Diagnosis Date Noted   [redacted] weeks gestation of pregnancy 06/18/2023   Anemia complicating childbirth 06/18/2023   Diseases of the digestive system complicating childbirth 06/18/2023   Diseases of the nervous system complicating childbirth 06/18/2023   Gastro-esophageal reflux disease without esophagitis 06/18/2023   Hypoglycemia, unspecified 06/18/2023   Long term (current) use of aspirin 06/18/2023   Maternal care for excessive fetal growth, third trimester, not applicable or unspecified 06/18/2023   Migraine, unspecified, not intractable, without status migrainosus 06/18/2023   Obstruction of bile duct 06/18/2023   Single live birth 06/18/2023   GAD (generalized anxiety disorder) 04/17/2020   Moderate episode of recurrent major depressive disorder (HCC) 04/17/2020   Chronic non-seasonal allergic rhinitis 10/21/2016   Mild intermittent asthma without complication 10/21/2016    Past Surgical History:  Procedure Laterality Date   CESAREAN SECTION  2022    OB History     Gravida  1   Para      Term      Preterm      AB      Living          SAB      IAB      Ectopic      Multiple      Live Births               Home Medications    Prior to Admission medications   Medication Sig Start Date End Date Taking? Authorizing Provider  albuterol (VENTOLIN HFA) 108 (90 Base) MCG/ACT inhaler Inhale 2 puffs into the lungs every 6 (six) hours as needed for wheezing or shortness of breath. 06/16/23   Suzan Slick, MD  buPROPion (WELLBUTRIN XL) 300 MG 24 hr tablet Take 300 mg by mouth daily.    [provider]  famotidine (PEPCID) 20 MG tablet TAKE 1 TABLET BY MOUTH TWICE A DAY 06/02/23   Suzan Slick, MD  hydrOXYzine (ATARAX) 25 MG tablet Take 25 mg by mouth every 8 (eight) hours as needed for anxiety.    [provider]  ondansetron (ZOFRAN-ODT) 4 MG disintegrating tablet Take 1 tablet (4 mg total) by mouth every 8 (eight) hours as needed for nausea. 06/01/23   Suzan Slick, MD    Family History Family History  Problem Relation Age of Onset   Healthy Mother    Hypertension Father    Hypertension Sister     Social History Social History   Tobacco Use   Smoking status: Never    Passive exposure: Never   Smokeless tobacco: Never  Vaping Use   Vaping  status: Never Used  Substance Use Topics   Alcohol use: Never   Drug use: Never     Allergies   Patient has no known allergies.   Review of Systems Review of Systems   Physical Exam Triage Vital Signs ED Triage Vitals  Encounter Vitals Group     BP 06/18/23 0845 116/82     Systolic BP Percentile --      Diastolic BP Percentile --      Pulse Rate 06/18/23 0845 85     Resp 06/18/23 0845 16     Temp 06/18/23 0845 98.3 F (36.8 C)     Temp Source 06/18/23 0845 Oral     SpO2 06/18/23 0845 96 %     Weight --      Height --      Head Circumference --      Peak Flow --      Pain Score 06/18/23 0847 9     Pain Loc --      Pain Education --      Exclude from Growth Chart --    No data found.  Updated Vital  Signs BP 116/82 (BP Location: Left Arm)   Pulse 85   Temp 98.3 F (36.8 C) (Oral)   Resp 16   LMP 05/21/2023 (Approximate)   SpO2 96%   Visual Acuity Right Eye Distance:   Left Eye Distance:   Bilateral Distance:    Right Eye Near:   Left Eye Near:    Bilateral Near:     Physical Exam Constitutional:      Appearance: Normal appearance.  Eyes:     Extraocular Movements: Extraocular movements intact.     Conjunctiva/sclera: Conjunctivae normal.     Pupils: Pupils are equal, round, and reactive to light.  Pulmonary:     Effort: Pulmonary effort is normal.  Neurological:     General: No focal deficit present.     Mental Status: She is alert and oriented to person, place, and time. Mental status is at baseline.     Cranial Nerves: No cranial nerve deficit.     Sensory: No sensory deficit.     Motor: No weakness.     Coordination: Coordination normal.     Gait: Gait normal.      UC Treatments / Results  Labs (all labs ordered are listed, but only abnormal results are displayed) Labs Reviewed - No data to display  EKG   Radiology No results found.  Procedures Procedures (including critical care time)  Medications Ordered in UC Medications  ketorolac (TORADOL) 30 MG/ML injection 30 mg (has no administration in time range)  metoCLOPramide (REGLAN) injection 10 mg (has no administration in time range)  SUMAtriptan (IMITREX) injection 6 mg (has no administration in time range)  dexamethasone (DECADRON) injection 10 mg (has no administration in time range)    Initial Impression / Assessment and Plan / UC Course  I have reviewed the triage vital signs and the nursing notes.  Pertinent labs & imaging results that were available during my care of the patient were reviewed by me and considered in my medical decision making (see chart for details).  Intractable migraine without aura and with status migrainosus  No neurological deficits on exam, vitals are stable,  stable for outpatient management, known history of migraines, presentation consistent with prior experiences, Toradol, Decadron, Reglan and Imitrex given in office, on reevaluation symptoms have began to improve, may continue use of over-the-counter analgesics while at home, advised  PCP follow-up if symptoms continue to persist, given strict ER precautions Final Clinical Impressions(s) / UC Diagnoses   Final diagnoses:  Intractable migraine without aura and with status migrainosus     Discharge Instructions      For your headache  -On exam there are no abnormalities neurologically -You have been given an injection of Toradol and Imitrex and Reglan and Decadron here in the office today to help minimize your symptoms -You may continue use of ibuprofen and Excedrin for management at home -While headaches are present ensure that you are getting adequate rest and adequate fluid intake -Participate in low stimulation activities avoiding bright lights and loud noises when symptoms are present -If your headaches continue to persist please follow-up with your primary doctor for reevaluation -At any point if you have the worst headache of your life please go to the nearest emergency department for immediate evaluation    ED Prescriptions   None    PDMP not reviewed this encounter.   Valinda Hoar, NP 06/18/23 1324    Valinda Hoar, NP 06/18/23 1159

## 2023-06-18 NOTE — ED Notes (Signed)
Patient monitored for 20 mins and tolerated well.

## 2023-06-18 NOTE — Discharge Instructions (Signed)
For your headache  -On exam there are no abnormalities neurologically -You have been given an injection of Toradol and Imitrex and Reglan and Decadron here in the office today to help minimize your symptoms -You may continue use of ibuprofen and Excedrin for management at home -While headaches are present ensure that you are getting adequate rest and adequate fluid intake -Participate in low stimulation activities avoiding bright lights and loud noises when symptoms are present -If your headaches continue to persist please follow-up with your primary doctor for reevaluation -At any point if you have the worst headache of your life please go to the nearest emergency department for immediate evaluation

## 2023-07-16 DIAGNOSIS — F410 Panic disorder [episodic paroxysmal anxiety] without agoraphobia: Secondary | ICD-10-CM | POA: Diagnosis not present

## 2023-07-16 DIAGNOSIS — F331 Major depressive disorder, recurrent, moderate: Secondary | ICD-10-CM | POA: Diagnosis not present

## 2023-07-23 ENCOUNTER — Other Ambulatory Visit: Payer: Self-pay | Admitting: Family Medicine

## 2023-07-23 DIAGNOSIS — R1084 Generalized abdominal pain: Secondary | ICD-10-CM

## 2023-08-05 NOTE — L&D Delivery Note (Signed)
 Delivery Note  First Stage: Labor onset: 03/05/24 @ 0200 Augmentation : AROM, pitocin  Analgesia /Anesthesia intrapartum: epidural AROM at 2230  Second Stage: Complete dilation at 0335 Onset of pushing at 0352 FHR second stage Category II, recurrent late decelerations, moderate variability, overall reassured by good progress in second stage and imminent delivery  Delivery of a viable female infant 03/05/2024 at 0508 by Edsel Blush, CNM. delivery of fetal head in OA position with restitution to ROA. No nuchal cord;  Anterior then posterior shoulders delivered easily with gentle downward traction. Baby placed on mom's chest, and attended to by peds.  Cord double clamped after cessation of pulsation, cut by Therisa Cord blood sample collected   Third Stage: Placenta delivered Keren intact with 3 VC @ [phone] Placenta disposition: discarded Uterine tone firm / bleeding scant  1st degree perineal, right periurethral laceration identified  Anesthesia for repair: epidural Repair 2-0 Vicryl for perineal, 3-0 Vicryl for periurethral Est. Blood Loss (mL):  Complications: none  Mom to postpartum.  Baby to Couplet care / Skin to Skin.  Newborn: Birth Weight: TBD, infant skin-to-skin  Apgar Scores: 9, 10 Feeding planned: breastfeeding and formula

## 2023-08-20 DIAGNOSIS — O99891 Other specified diseases and conditions complicating pregnancy: Secondary | ICD-10-CM | POA: Diagnosis not present

## 2023-08-20 DIAGNOSIS — F331 Major depressive disorder, recurrent, moderate: Secondary | ICD-10-CM | POA: Diagnosis not present

## 2023-08-20 DIAGNOSIS — O0992 Supervision of high risk pregnancy, unspecified, second trimester: Secondary | ICD-10-CM | POA: Insufficient documentation

## 2023-08-20 DIAGNOSIS — O0991 Supervision of high risk pregnancy, unspecified, first trimester: Secondary | ICD-10-CM | POA: Diagnosis not present

## 2023-08-20 DIAGNOSIS — R7303 Prediabetes: Secondary | ICD-10-CM | POA: Diagnosis not present

## 2023-08-20 DIAGNOSIS — F410 Panic disorder [episodic paroxysmal anxiety] without agoraphobia: Secondary | ICD-10-CM | POA: Diagnosis not present

## 2023-08-20 DIAGNOSIS — N912 Amenorrhea, unspecified: Secondary | ICD-10-CM | POA: Diagnosis not present

## 2023-08-20 LAB — OB RESULTS CONSOLE HEPATITIS B SURFACE ANTIGEN: Hepatitis B Surface Ag: NEGATIVE

## 2023-08-20 LAB — OB RESULTS CONSOLE RUBELLA ANTIBODY, IGM: Rubella: IMMUNE

## 2023-08-20 LAB — HEPATITIS C ANTIBODY: HCV Ab: NEGATIVE

## 2023-08-20 LAB — OB RESULTS CONSOLE VARICELLA ZOSTER ANTIBODY, IGG: Varicella: NON-IMMUNE/NOT IMMUNE

## 2023-08-24 DIAGNOSIS — R7303 Prediabetes: Secondary | ICD-10-CM | POA: Diagnosis not present

## 2023-09-01 DIAGNOSIS — R87610 Atypical squamous cells of undetermined significance on cytologic smear of cervix (ASC-US): Secondary | ICD-10-CM | POA: Diagnosis not present

## 2023-09-01 DIAGNOSIS — O0991 Supervision of high risk pregnancy, unspecified, first trimester: Secondary | ICD-10-CM | POA: Diagnosis not present

## 2023-09-06 ENCOUNTER — Other Ambulatory Visit: Payer: Self-pay | Admitting: Family Medicine

## 2023-09-06 DIAGNOSIS — R1084 Generalized abdominal pain: Secondary | ICD-10-CM

## 2023-09-29 DIAGNOSIS — O34219 Maternal care for unspecified type scar from previous cesarean delivery: Secondary | ICD-10-CM | POA: Insufficient documentation

## 2023-10-05 ENCOUNTER — Other Ambulatory Visit: Payer: Self-pay | Admitting: Family Medicine

## 2023-10-05 DIAGNOSIS — R1084 Generalized abdominal pain: Secondary | ICD-10-CM

## 2023-10-23 DIAGNOSIS — O0992 Supervision of high risk pregnancy, unspecified, second trimester: Secondary | ICD-10-CM | POA: Diagnosis not present

## 2023-10-23 DIAGNOSIS — Z3A17 17 weeks gestation of pregnancy: Secondary | ICD-10-CM | POA: Diagnosis not present

## 2023-10-23 DIAGNOSIS — O34219 Maternal care for unspecified type scar from previous cesarean delivery: Secondary | ICD-10-CM | POA: Diagnosis not present

## 2023-11-01 DIAGNOSIS — Z3483 Encounter for supervision of other normal pregnancy, third trimester: Secondary | ICD-10-CM | POA: Diagnosis not present

## 2023-11-01 DIAGNOSIS — Z3482 Encounter for supervision of other normal pregnancy, second trimester: Secondary | ICD-10-CM | POA: Diagnosis not present

## 2023-11-22 DIAGNOSIS — Z8659 Personal history of other mental and behavioral disorders: Secondary | ICD-10-CM | POA: Insufficient documentation

## 2023-11-22 DIAGNOSIS — Z87898 Personal history of other specified conditions: Secondary | ICD-10-CM | POA: Insufficient documentation

## 2023-11-22 DIAGNOSIS — J45909 Unspecified asthma, uncomplicated: Secondary | ICD-10-CM | POA: Insufficient documentation

## 2023-11-22 DIAGNOSIS — O09899 Supervision of other high risk pregnancies, unspecified trimester: Secondary | ICD-10-CM | POA: Insufficient documentation

## 2023-11-26 DIAGNOSIS — F331 Major depressive disorder, recurrent, moderate: Secondary | ICD-10-CM | POA: Diagnosis not present

## 2023-11-26 DIAGNOSIS — F410 Panic disorder [episodic paroxysmal anxiety] without agoraphobia: Secondary | ICD-10-CM | POA: Diagnosis not present

## 2023-12-01 ENCOUNTER — Other Ambulatory Visit: Payer: Self-pay | Admitting: Family Medicine

## 2023-12-01 DIAGNOSIS — R1084 Generalized abdominal pain: Secondary | ICD-10-CM

## 2023-12-24 DIAGNOSIS — Z113 Encounter for screening for infections with a predominantly sexual mode of transmission: Secondary | ICD-10-CM | POA: Diagnosis not present

## 2023-12-24 DIAGNOSIS — Z131 Encounter for screening for diabetes mellitus: Secondary | ICD-10-CM | POA: Diagnosis not present

## 2023-12-24 DIAGNOSIS — Z23 Encounter for immunization: Secondary | ICD-10-CM | POA: Diagnosis not present

## 2023-12-24 DIAGNOSIS — O0992 Supervision of high risk pregnancy, unspecified, second trimester: Secondary | ICD-10-CM | POA: Diagnosis not present

## 2023-12-24 LAB — OB RESULTS CONSOLE RPR: RPR: NONREACTIVE

## 2023-12-24 LAB — OB RESULTS CONSOLE HIV ANTIBODY (ROUTINE TESTING): HIV: NONREACTIVE

## 2024-02-10 DIAGNOSIS — Z8719 Personal history of other diseases of the digestive system: Secondary | ICD-10-CM | POA: Diagnosis not present

## 2024-02-10 DIAGNOSIS — Z8759 Personal history of other complications of pregnancy, childbirth and the puerperium: Secondary | ICD-10-CM | POA: Diagnosis not present

## 2024-02-10 DIAGNOSIS — O0993 Supervision of high risk pregnancy, unspecified, third trimester: Secondary | ICD-10-CM | POA: Diagnosis not present

## 2024-02-10 LAB — OB RESULTS CONSOLE GC/CHLAMYDIA
Chlamydia: NEGATIVE
Neisseria Gonorrhea: NEGATIVE

## 2024-02-10 LAB — OB RESULTS CONSOLE GBS: GBS: POSITIVE

## 2024-02-11 ENCOUNTER — Telehealth: Payer: Self-pay | Admitting: Family Medicine

## 2024-02-11 NOTE — Telephone Encounter (Signed)
 Called patient to help with MyChart and couldn't help with her issue, gave her the number to MyChart.

## 2024-02-11 NOTE — Telephone Encounter (Signed)
 Copied from CRM [id number]. Topic: General - Other >> Feb 11, 2024 11:33 AM Marissa P wrote: Reason for CRM: Please contact patient back for help with mychart please as it keeps populating old number. Thank you

## 2024-02-13 DIAGNOSIS — B951 Streptococcus, group B, as the cause of diseases classified elsewhere: Secondary | ICD-10-CM | POA: Insufficient documentation

## 2024-02-22 DIAGNOSIS — O169 Unspecified maternal hypertension, unspecified trimester: Secondary | ICD-10-CM | POA: Diagnosis not present

## 2024-03-04 ENCOUNTER — Inpatient Hospital Stay: Admitting: Anesthesiology

## 2024-03-04 ENCOUNTER — Encounter: Payer: Self-pay | Admitting: Obstetrics and Gynecology

## 2024-03-04 ENCOUNTER — Other Ambulatory Visit: Payer: Self-pay

## 2024-03-04 ENCOUNTER — Inpatient Hospital Stay
Admission: EM | Admit: 2024-03-04 | Discharge: 2024-03-06 | DRG: 806 | Disposition: A | Discharge status: Home / Self Care | Attending: Certified Nurse Midwife | Admitting: Certified Nurse Midwife

## 2024-03-04 DIAGNOSIS — J4520 Mild intermittent asthma, uncomplicated: Secondary | ICD-10-CM | POA: Diagnosis present

## 2024-03-04 DIAGNOSIS — O99824 Streptococcus B carrier state complicating childbirth: Secondary | ICD-10-CM | POA: Diagnosis not present

## 2024-03-04 DIAGNOSIS — O9902 Anemia complicating childbirth: Secondary | ICD-10-CM | POA: Diagnosis present

## 2024-03-04 DIAGNOSIS — Z79899 Other long term (current) drug therapy: Secondary | ICD-10-CM | POA: Diagnosis not present

## 2024-03-04 DIAGNOSIS — O34219 Maternal care for unspecified type scar from previous cesarean delivery: Principal | ICD-10-CM | POA: Diagnosis present

## 2024-03-04 DIAGNOSIS — F411 Generalized anxiety disorder: Secondary | ICD-10-CM | POA: Diagnosis present

## 2024-03-04 DIAGNOSIS — Z3A40 40 weeks gestation of pregnancy: Secondary | ICD-10-CM

## 2024-03-04 DIAGNOSIS — O26893 Other specified pregnancy related conditions, third trimester: Secondary | ICD-10-CM | POA: Diagnosis present

## 2024-03-04 DIAGNOSIS — O9952 Diseases of the respiratory system complicating childbirth: Secondary | ICD-10-CM | POA: Diagnosis not present

## 2024-03-04 DIAGNOSIS — Z8249 Family history of ischemic heart disease and other diseases of the circulatory system: Secondary | ICD-10-CM | POA: Diagnosis not present

## 2024-03-04 DIAGNOSIS — O34211 Maternal care for low transverse scar from previous cesarean delivery: Secondary | ICD-10-CM | POA: Diagnosis not present

## 2024-03-04 DIAGNOSIS — Z8719 Personal history of other diseases of the digestive system: Secondary | ICD-10-CM

## 2024-03-04 DIAGNOSIS — O480 Post-term pregnancy: Secondary | ICD-10-CM | POA: Diagnosis not present

## 2024-03-04 DIAGNOSIS — O479 False labor, unspecified: Principal | ICD-10-CM | POA: Diagnosis present

## 2024-03-04 DIAGNOSIS — O99344 Other mental disorders complicating childbirth: Secondary | ICD-10-CM | POA: Diagnosis not present

## 2024-03-04 DIAGNOSIS — F331 Major depressive disorder, recurrent, moderate: Secondary | ICD-10-CM | POA: Diagnosis not present

## 2024-03-04 LAB — TYPE AND SCREEN
ABO/RH(D): A POS
Antibody Screen: NEGATIVE

## 2024-03-04 LAB — CBC
HCT: 39.2 % (ref 36.0–46.0)
Hemoglobin: 12.9 g/dL (ref 12.0–15.0)
MCH: 28.1 pg (ref 26.0–34.0)
MCHC: 32.9 g/dL (ref 30.0–36.0)
MCV: 85.4 fL (ref 80.0–100.0)
Platelets: 290 K/uL (ref 150–400)
RBC: 4.59 MIL/uL (ref 3.87–5.11)
RDW: 13.2 % (ref 11.5–15.5)
WBC: 14.9 K/uL — ABNORMAL HIGH (ref 4.0–10.5)
nRBC: 0 % (ref 0.0–0.2)

## 2024-03-04 LAB — ABO/RH: ABO/RH(D): A POS

## 2024-03-04 MED ORDER — LACTATED RINGERS IV SOLN: 125.0000 mL/h | INTRAVENOUS | Status: DC

## 2024-03-04 MED ORDER — OXYTOCIN-SODIUM CHLORIDE 30-0.9 UT/500ML-% IV SOLN
2.5000 [IU]/h | INTRAVENOUS | Status: DC
  Filled 2024-03-04 (×2): qty 500

## 2024-03-04 MED ORDER — PRENATAL MULTIVITAMIN CH
1.0000 | ORAL_TABLET | Freq: Every day | ORAL | Status: DC
  Filled 2024-03-04: qty 1

## 2024-03-04 MED ORDER — FENTANYL CITRATE (PF) 100 MCG/2ML IJ SOLN: 50.0000 ug | INTRAMUSCULAR | Status: DC | PRN

## 2024-03-04 MED ORDER — OXYTOCIN BOLUS FROM INFUSION
333.0000 mL | Freq: Once | INTRAVENOUS | Status: AC
  Administered 2024-03-05: 333 mL via INTRAVENOUS

## 2024-03-04 MED ORDER — TERBUTALINE SULFATE 1 MG/ML IJ SOLN: 0.2500 mg | Freq: Once | INTRAMUSCULAR | Status: DC | PRN

## 2024-03-04 MED ORDER — DIPHENHYDRAMINE HCL 50 MG/ML IJ SOLN: 12.5000 mg | INTRAMUSCULAR | Status: DC | PRN

## 2024-03-04 MED ORDER — AMMONIA AROMATIC IN INHA
RESPIRATORY_TRACT | Status: AC
  Filled 2024-03-04: qty 10

## 2024-03-04 MED ORDER — EPHEDRINE 5 MG/ML INJ: 10.0000 mg | INTRAVENOUS | Status: DC | PRN

## 2024-03-04 MED ORDER — PHENYLEPHRINE 80 MCG/ML (10ML) SYRINGE FOR IV PUSH (FOR BLOOD PRESSURE SUPPORT): 80.0000 ug | PREFILLED_SYRINGE | INTRAVENOUS | Status: DC | PRN

## 2024-03-04 MED ORDER — LACTATED RINGERS IV SOLN: INTRAVENOUS | Status: DC

## 2024-03-04 MED ORDER — OXYTOCIN-SODIUM CHLORIDE 30-0.9 UT/500ML-% IV SOLN
1.0000 m[IU]/min | INTRAVENOUS | Status: DC
  Administered 2024-03-04: 2 m[IU]/min via INTRAVENOUS

## 2024-03-04 MED ORDER — FENTANYL-BUPIVACAINE-NACL 0.5-0.125-0.9 MG/250ML-% EP SOLN
12.0000 mL/h | EPIDURAL | Status: DC | PRN
  Administered 2024-03-04: 12 mL/h via EPIDURAL

## 2024-03-04 MED ORDER — ONDANSETRON HCL 4 MG/2ML IJ SOLN: 4.0000 mg | Freq: Four times a day (QID) | INTRAMUSCULAR | Status: DC | PRN

## 2024-03-04 MED ORDER — DOCUSATE SODIUM 100 MG PO CAPS
100.0000 mg | ORAL_CAPSULE | Freq: Every day | ORAL | Status: DC
  Administered 2024-03-05: 100 mg via ORAL
  Filled 2024-03-04: qty 1

## 2024-03-04 MED ORDER — PENICILLIN G POT IN DEXTROSE 60000 UNIT/ML IV SOLN
3.0000 10*6.[IU] | INTRAVENOUS | Status: DC
  Administered 2024-03-04 – 2024-03-05 (×2): 3 10*6.[IU] via INTRAVENOUS
  Filled 2024-03-04 (×2): qty 50

## 2024-03-04 MED ORDER — FENTANYL-BUPIVACAINE-NACL 0.5-0.125-0.9 MG/250ML-% EP SOLN
EPIDURAL | Status: AC
  Filled 2024-03-04: qty 250

## 2024-03-04 MED ORDER — OXYTOCIN 10 UNIT/ML IJ SOLN
INTRAMUSCULAR | Status: AC
  Filled 2024-03-04: qty 2

## 2024-03-04 MED ORDER — LIDOCAINE HCL (PF) 1 % IJ SOLN
INTRAMUSCULAR | Status: DC | PRN
  Administered 2024-03-04: 2 mL via SUBCUTANEOUS

## 2024-03-04 MED ORDER — LACTATED RINGERS IV SOLN
500.0000 mL | Freq: Once | INTRAVENOUS | Status: AC
  Administered 2024-03-04: 500 mL via INTRAVENOUS

## 2024-03-04 MED ORDER — ZOLPIDEM TARTRATE 5 MG PO TABS: 5.0000 mg | ORAL_TABLET | Freq: Every evening | ORAL | Status: DC | PRN

## 2024-03-04 MED ORDER — ACETAMINOPHEN 325 MG PO TABS
650.0000 mg | ORAL_TABLET | ORAL | Status: DC | PRN
  Administered 2024-03-05: 650 mg via ORAL

## 2024-03-04 MED ORDER — LIDOCAINE HCL (PF) 1 % IJ SOLN
30.0000 mL | INTRAMUSCULAR | Status: DC | PRN
  Filled 2024-03-04: qty 30

## 2024-03-04 MED ORDER — LACTATED RINGERS IV SOLN: 500.0000 mL | INTRAVENOUS | Status: DC | PRN

## 2024-03-04 MED ORDER — CALCIUM CARBONATE ANTACID 500 MG PO CHEW: 2.0000 | CHEWABLE_TABLET | ORAL | Status: DC | PRN

## 2024-03-04 MED ORDER — ACETAMINOPHEN 325 MG PO TABS
650.0000 mg | ORAL_TABLET | ORAL | Status: DC | PRN
  Filled 2024-03-04: qty 2

## 2024-03-04 MED ORDER — SODIUM CHLORIDE 0.9 % IV SOLN
5.0000 10*6.[IU] | Freq: Once | INTRAVENOUS | Status: AC
  Administered 2024-03-04: 5 10*6.[IU] via INTRAVENOUS
  Filled 2024-03-04: qty 5

## 2024-03-04 MED ORDER — LIDOCAINE-EPINEPHRINE (PF) 1.5 %-1:200000 IJ SOLN
INTRAMUSCULAR | Status: DC | PRN
  Administered 2024-03-04: 3 mL via EPIDURAL

## 2024-03-04 MED ORDER — MISOPROSTOL 200 MCG PO TABS
ORAL_TABLET | ORAL | Status: AC
  Filled 2024-03-04: qty 1

## 2024-03-04 MED ORDER — SOD CITRATE-CITRIC ACID 500-334 MG/5ML PO SOLN: 30.0000 mL | ORAL | Status: DC | PRN

## 2024-03-04 NOTE — H&P (Signed)
 OB History & Physical   History of Present Illness:  Chief Complaint:   HPI:  Jo Baker is a 23 y.o. G36P1001 female at [redacted]w[redacted]d dated by LMP.  She presents to L&D for uterine contractions since 2am that have become stronger and closer together. She got to the hospital and was 3/80/-2, did a 2hr labor evaluation where she reported her contractions got stronger and more painful, and her cervix was 4/90/0.   Pregnancy Issues: 1. Previous cesarean section for fetal distress 2. History of cholestasis 3. Asthma 4. GBS positive 5. Varicella non-immune  Maternal Medical History:   Past Medical History:  Diagnosis Date   Depression    Headache    Seasonal allergies     Past Surgical History:  Procedure Laterality Date   CESAREAN SECTION  2022    No Known Allergies  Prior to Admission medications   Medication Sig Start Date End Date Taking? Authorizing Provider  albuterol  (VENTOLIN  HFA) 108 (90 Base) MCG/ACT inhaler Inhale 2 puffs into the lungs every 6 (six) hours as needed for wheezing or shortness of breath. 06/16/23  Yes Rucker, Torrence GRADE, MD  buPROPion (WELLBUTRIN XL) 300 MG 24 hr tablet Take 300 mg by mouth daily.   Yes [provider]  Prenatal Vit-Fe Fumarate-FA (MULTIVITAMIN-PRENATAL) 27-0.8 MG TABS tablet Take 1 tablet by mouth daily at 12 noon.   Yes [provider]  famotidine  (PEPCID ) 20 MG tablet TAKE 1 TABLET BY MOUTH TWICE A DAY Patient not taking: Reported on 03/04/2024 06/02/23   Colette Torrence GRADE, MD  hydrOXYzine (ATARAX) 25 MG tablet Take 25 mg by mouth every 8 (eight) hours as needed for anxiety. Patient not taking: Reported on 03/04/2024    [provider]  ondansetron  (ZOFRAN -ODT) 4 MG disintegrating tablet TAKE 1 TABLET BY MOUTH EVERY 8 HOURS AS NEEDED FOR NAUSEA Patient not taking: Reported on 03/04/2024 12/02/23   Colette Torrence GRADE, MD    Prenatal care site: [facility]   Social History: She  reports that she has never  smoked. She has never been exposed to tobacco smoke. She has never used smokeless tobacco. She reports that she does not drink alcohol and does not use drugs.  Family History: family history includes Healthy in her mother; Hypertension in her father and sister.   Review of Systems: A full review of systems was performed and negative except as noted in the HPI.     Physical Exam:  Vital Signs: BP 124/77 (BP Location: Right Arm)   Pulse 96   Temp 98.1 F (36.7 C) (Oral)   Resp 18   LMP 05/29/2023  General: no acute distress.  HEENT: normocephalic, atraumatic Heart: regular rate & rhythm.  No murmurs/rubs/gallops Lungs: clear to auscultation bilaterally, normal respiratory effort Abdomen: soft, gravid, non-tender;  EFW: 7lb Pelvic:   External: Normal external female genitalia  Cervix: Dilation: 4 / Effacement (%): 90 / Station: 0    Extremities: non-tender, symmetric, no edema bilaterally.  DTRs: +2  Neurologic: Alert & oriented x 3.    No results found for this or any previous visit (from the past 24 hours).  Pertinent Results:  Prenatal Labs: Blood type/Rh A positive  Antibody screen neg  Rubella Immune  Varicella Non-Immune  RPR NR  HBsAg Neg  HIV NR  GC neg  Chlamydia neg  Genetic screening negative  1 hour GTT 113  3 hour GTT   GBS positive   FHT: 135bpm, moderate variability, accelerations present, no decelerations,  Category I tracing TOCO: contractions q2-40min, palpate moderate SVE:  Dilation: 4 / Effacement (%): 90 / Station: 0    Cephalic by leopolds  No results found.  Assessment:  Jo Baker is a 23 y.o. G33P1001 female at [redacted]w[redacted]d with uterine contractions, TOLAC.   Plan:  1. Admit to Labor & Delivery; consents reviewed and obtained - Dr. JONETTA. Schermerhorn and Dr. ONEIDA. Schermerhorn notified of admission  2. Fetal Well being  - Fetal Tracing: Category I tracing - Group B Streptococcus ppx indicated: treat with PCN, start treatment at this time -  Presentation: vertex confirmed by SVE   3. Routine OB: - Prenatal labs reviewed, as above - Rh positive - CBC, T&S, RPR on admit - Clear fluids, IVF  4. Monitoring of Labor -  Contractions q2-25min, external toco in place -  Pelvis adequate for trial of labor -  Plan for augmentation with pitocin and/or AROM as indicated -  Plan for continuous fetal monitoring  -  Maternal pain control as desired; requesting regional anesthesia - Anticipate vaginal delivery  5. Post Partum Planning: - Infant feeding: breast and formula - Contraception: Mirena IUD - Tdap: received AP - Flu: out of season  6. TOLAC: - previous cesarean section for fetal intolerance of labor - counseled in the office for TOLAC - anesthesia notified of TOLAC admission - OB physician notified of TOLAC admission - continuous fetal monitoring - no cervical ripening d/t previous cesarean section - will recommend IUPC/FSE upon SROM or AROM  7. Asthma:  - has not used inhaler in months - inhaler PRN  Edsel Charlies Blush, CNM 03/04/24 4:21 PM

## 2024-03-04 NOTE — Anesthesia Preprocedure Evaluation (Addendum)
 Anesthesia Evaluation  Patient identified by MRN, date of birth, ID band Patient awake    Reviewed: Allergy & Precautions, H&P , NPO status , Patient's Chart, lab work & pertinent test results  Airway Mallampati: II  TM Distance: >3 FB Neck ROM: full    Dental no notable dental hx.    Pulmonary asthma    Pulmonary exam normal        Cardiovascular Exercise Tolerance: Good negative cardio ROS Normal cardiovascular exam     Neuro/Psych  Headaches  Anxiety Depression       GI/Hepatic negative GI ROS,,,  Endo/Other    Renal/GU   negative genitourinary   Musculoskeletal   Abdominal  (+) + obese  Peds  Hematology negative hematology ROS (+)   Anesthesia Other Findings Past Medical History: No date: Depression No date: Headache No date: Seasonal allergies  Past Surgical History: 2022: CESAREAN SECTION     Reproductive/Obstetrics (+) Pregnancy                              Anesthesia Physical Anesthesia Plan  ASA: 2  Anesthesia Plan: Epidural   Post-op Pain Management:    Induction:   PONV Risk Score and Plan:   Airway Management Planned:   Additional Equipment:   Intra-op Plan:   Post-operative Plan:   Informed Consent: I have reviewed the patients History and Physical, chart, labs and discussed the procedure including the risks, benefits and alternatives for the proposed anesthesia with the patient or authorized representative who has indicated his/her understanding and acceptance.       Plan Discussed with: Anesthesiologist and CRNA  Anesthesia Plan Comments:          Anesthesia Quick Evaluation

## 2024-03-04 NOTE — Progress Notes (Signed)
 Labor Progress Note  Jo Baker is a 23 y.o. G2P1001 at [redacted]w[redacted]d by LMP admitted for active labor  Subjective: she is comfortable after her epidural, denies any concerns  Objective: BP 94/64   Pulse (!) 117   Temp 98.3 F (36.8 C) (Oral)   Resp 18   Ht 5' 6 (1.676 m)   Wt 88.9 kg   LMP 05/29/2023   SpO2 98%   BMI 31.64 kg/m  Notable VS details: reviewed  Fetal Assessment: FHT:  FHR: 135 bpm, variability: moderate,  accelerations:  Present,  decelerations:  Absent Category/reactivity:  Category I UC:   regular, every 4-5 minutes SVE:    Dilation: 5cm  Effacement: 90%  Station:  -1  Consistency: soft  Position: anterior  Membrane status:intact Amniotic color: n/a  Labs: Lab Results  Component Value Date   WBC 14.9 (H) 03/04/2024   HGB 12.9 03/04/2024   HCT 39.2 03/04/2024   MCV 85.4 03/04/2024   PLT 290 03/04/2024    Assessment / Plan: 23 year old G2P1 at [redacted]w[redacted]d with active labor, TOLAC  Labor: Minimal labor progress, will AROM after adequately treated for GBS Preeclampsia:  labs stable Fetal Wellbeing:  Category I Pain Control:  Epidural I/D:  GBS positive, will give second dose of PCN then AROM Anticipated MOD:  VBAC  Jo Baker, CNM 03/04/2024, 10:56 PM

## 2024-03-04 NOTE — OB Triage Note (Signed)
 Patient states contraction started this morning around 2am that got much closer at 10am and are now 3- min apart. Pt rates 7/10 in her back. Pt states baby is moving well. Denies LOF, vaginal bleeding,vomiting or diarrhea. Pt does state she has been nauseated. Monitors applied and assessing. VSS.

## 2024-03-04 NOTE — Anesthesia Procedure Notes (Signed)
 Epidural Patient location during procedure: OB Start time: 03/04/2024 6:17 PM End time: 03/04/2024 6:33 PM  Staffing Anesthesiologist: Vicci Camellia Glatter, MD Performed: anesthesiologist   Preanesthetic Checklist Completed: patient identified, IV checked, site marked, risks and benefits discussed, surgical consent, monitors and equipment checked, pre-op evaluation and timeout performed  Epidural Patient position: sitting Prep: ChloraPrep Patient monitoring: heart rate, continuous pulse ox and blood pressure Approach: midline Location: L3-L4 Injection technique: LOR saline  Needle:  Needle type: Tuohy  Needle gauge: 18 G Needle length: 9 cm Needle insertion depth: 7 cm Catheter type: closed end Catheter size: 20 Guage Catheter at skin depth: 13 cm Test dose: negative and 1.5% lidocaine  with Epi 1:200 K  Assessment Events: blood not aspirated, no cerebrospinal fluid, injection not painful, no injection resistance and no paresthesia  Additional Notes Reason for block:procedure for pain

## 2024-03-04 NOTE — Progress Notes (Addendum)
 Labor Progress Note  Jo Baker is a 23 y.o. G2P1001 at [redacted]w[redacted]d by LMP admitted for active labor  Subjective: she is comfortable with her epidural  Objective: BP 94/64   Pulse (!) 117   Temp 98.3 F (36.8 C) (Oral)   Resp 18   Ht 5' 6 (1.676 m)   Wt 88.9 kg   LMP 05/29/2023   SpO2 98%   BMI 31.64 kg/m  Notable VS details: reviewed  Fetal Assessment: FHT:  FHR: 135 bpm, variability: moderate,  accelerations:  Present,  decelerations:  Absent Category/reactivity:  Category I UC:   regular, every 5-6 minutes SVE:    Dilation: 5.5cm  Effacement: 90%  Station:  -1  Consistency: soft  Position: middle  Membrane status:AROM @ 2230 Amniotic color: clear  Labs: Lab Results  Component Value Date   WBC 14.9 (H) 03/04/2024   HGB 12.9 03/04/2024   HCT 39.2 03/04/2024   MCV 85.4 03/04/2024   PLT 290 03/04/2024    Assessment / Plan: 23 year old G2P1 at [redacted]w[redacted]d with active labor, TOLAC  Labor: Slow but steady labor progress, AROM for augmentation with FSE and IUPC placement. MVUs inadequate. Will start pitocin augmentation. Preeclampsia:  labs stable Fetal Wellbeing:  Category I Pain Control:  Epidural I/D:  GBS positive, has received 2 doses PCN Anticipated MOD:  VBAC  Edsel Charlies Blush, CNM 03/04/2024, 10:51 PM

## 2024-03-05 ENCOUNTER — Encounter: Payer: Self-pay | Admitting: Obstetrics and Gynecology

## 2024-03-05 DIAGNOSIS — O34219 Maternal care for unspecified type scar from previous cesarean delivery: Principal | ICD-10-CM

## 2024-03-05 DIAGNOSIS — Z8719 Personal history of other diseases of the digestive system: Secondary | ICD-10-CM

## 2024-03-05 LAB — RPR: RPR Ser Ql: NONREACTIVE

## 2024-03-05 MED ORDER — DIBUCAINE (PERIANAL) 1 % EX OINT: 1.0000 | TOPICAL_OINTMENT | CUTANEOUS | Status: DC | PRN

## 2024-03-05 MED ORDER — SENNOSIDES-DOCUSATE SODIUM 8.6-50 MG PO TABS
2.0000 | ORAL_TABLET | Freq: Every day | ORAL | Status: DC
  Administered 2024-03-06: 2 via ORAL
  Filled 2024-03-05: qty 2

## 2024-03-05 MED ORDER — ONDANSETRON HCL 4 MG/2ML IJ SOLN: 4.0000 mg | INTRAMUSCULAR | Status: DC | PRN

## 2024-03-05 MED ORDER — ZOLPIDEM TARTRATE 5 MG PO TABS: 5.0000 mg | ORAL_TABLET | Freq: Every evening | ORAL | Status: DC | PRN

## 2024-03-05 MED ORDER — PRENATAL MULTIVITAMIN CH
1.0000 | ORAL_TABLET | Freq: Every day | ORAL | Status: DC
  Administered 2024-03-05 – 2024-03-06 (×2): 1 via ORAL
  Filled 2024-03-05: qty 1

## 2024-03-05 MED ORDER — IBUPROFEN 600 MG PO TABS
600.0000 mg | ORAL_TABLET | Freq: Four times a day (QID) | ORAL | Status: DC
  Administered 2024-03-05 – 2024-03-06 (×4): 600 mg via ORAL
  Filled 2024-03-05 (×5): qty 1

## 2024-03-05 MED ORDER — SIMETHICONE 80 MG PO CHEW: 80.0000 mg | CHEWABLE_TABLET | ORAL | Status: DC | PRN

## 2024-03-05 MED ORDER — BENZOCAINE-MENTHOL 20-0.5 % EX AERO
1.0000 | INHALATION_SPRAY | CUTANEOUS | Status: DC | PRN
  Administered 2024-03-05 – 2024-03-06 (×2): 1 via TOPICAL
  Filled 2024-03-05 (×2): qty 56

## 2024-03-05 MED ORDER — DIPHENHYDRAMINE HCL 25 MG PO CAPS: 25.0000 mg | ORAL_CAPSULE | Freq: Four times a day (QID) | ORAL | Status: DC | PRN

## 2024-03-05 MED ORDER — IBUPROFEN 600 MG PO TABS
ORAL_TABLET | ORAL | Status: AC
  Administered 2024-03-05: 600 mg via ORAL
  Filled 2024-03-05: qty 1

## 2024-03-05 MED ORDER — TETANUS-DIPHTH-ACELL PERTUSSIS 5-2.5-18.5 LF-MCG/0.5 IM SUSY: 0.5000 mL | PREFILLED_SYRINGE | Freq: Once | INTRAMUSCULAR | Status: DC

## 2024-03-05 MED ORDER — HYDROXYZINE HCL 25 MG PO TABS: 25.0000 mg | ORAL_TABLET | Freq: Three times a day (TID) | ORAL | Status: DC | PRN

## 2024-03-05 MED ORDER — COCONUT OIL OIL
1.0000 | TOPICAL_OIL | Status: DC | PRN
  Administered 2024-03-06: 1 via TOPICAL
  Filled 2024-03-05: qty 7.5

## 2024-03-05 MED ORDER — ALBUTEROL SULFATE (2.5 MG/3ML) 0.083% IN NEBU
3.0000 mL | INHALATION_SOLUTION | Freq: Four times a day (QID) | RESPIRATORY_TRACT | Status: DC | PRN
Start: 2024-03-05 — End: 2024-03-06

## 2024-03-05 MED ORDER — ONDANSETRON HCL 4 MG PO TABS: 4.0000 mg | ORAL_TABLET | ORAL | Status: DC | PRN

## 2024-03-05 MED ORDER — BUPROPION HCL ER (XL) 300 MG PO TB24
300.0000 mg | ORAL_TABLET | Freq: Every day | ORAL | Status: DC
  Administered 2024-03-05: 300 mg via ORAL
  Filled 2024-03-05 (×2): qty 1

## 2024-03-05 MED ORDER — ACETAMINOPHEN 325 MG PO TABS
650.0000 mg | ORAL_TABLET | ORAL | Status: DC | PRN
  Administered 2024-03-06: 650 mg via ORAL
  Filled 2024-03-05: qty 2

## 2024-03-05 MED ORDER — WITCH HAZEL-GLYCERIN EX PADS
1.0000 | MEDICATED_PAD | CUTANEOUS | Status: DC | PRN
  Administered 2024-03-05 – 2024-03-06 (×2): 1 via TOPICAL
  Filled 2024-03-05 (×2): qty 100

## 2024-03-05 NOTE — Progress Notes (Signed)
 Labor Progress Note  Jo Baker is a 23 y.o. G2P1001 at [redacted]w[redacted]d by LMP admitted for active labor  Subjective: she reports a strong urge to push  Objective: BP 109/67   Pulse 80   Temp 98.3 F (36.8 C) (Oral)   Resp 18   Ht 5' 6 (1.676 m)   Wt 88.9 kg   LMP 05/29/2023   SpO2 100%   BMI 31.64 kg/m  Notable VS details: reviewed  Fetal Assessment: FHT:  FHR: 140 bpm, variability: moderate,  accelerations:  Present,  decelerations:  Present intermittent late decelerations Category/reactivity:  Category II UC:   regular, every 2-3 minutes SVE:    Dilation: 10cm  Effacement: 100%  Station:  +2  Consistency: soft  Position: anterior  Membrane status:AROM @ 2230 Amniotic color: clear  Labs: Lab Results  Component Value Date   WBC 14.9 (H) 03/04/2024   HGB 12.9 03/04/2024   HCT 39.2 03/04/2024   MCV 85.4 03/04/2024   PLT 290 03/04/2024    Assessment / Plan: 23 year old G2P1 at [redacted]w[redacted]d admitted in active labor, now second stage, TOLAC  Labor: begin pushing at this time, pitocin  at 55mu/min Preeclampsia:  labs stable Fetal Wellbeing:  Category II, good moderate variability, intermittent late decelerations  Pain Control:  Epidural I/D:  GBS positive, treated with PCN x 3 Anticipated MOD:  VBAC  Edsel Charlies Blush, CNM 03/05/2024, 4:35 AM

## 2024-03-05 NOTE — Discharge Summary (Shared)
 Postpartum Discharge Summary  Patient Name: Jo Baker DOB: 08/26/2000 MRN: [id number]  Date of admission: 03/04/2024 Delivery date:03/05/2024 Delivering provider: TANDA HOUSTON Baker Date of discharge: 03/06/2024  Primary OB: [facility] OFE:Ejupzwu'd last menstrual period was 05/29/2023. EDC Estimated Date of Delivery: 03/04/24 Gestational Age at Delivery: [redacted]w[redacted]d   Admitting diagnosis: Uterine contractions [O47.9] Intrauterine pregnancy: [redacted]w[redacted]d     Secondary diagnosis:   Principal Problem:   VBAC (vaginal birth after Cesarean) Active Problems:   GAD (generalized anxiety disorder)   Mild intermittent asthma without complication   Moderate episode of recurrent major depressive disorder (HCC)   Anemia complicating childbirth   Uterine contractions   History of cholestasis during pregnancy  Discharge Diagnosis: Term Pregnancy Delivered and VBAC                                                Post partum procedures:none Augmentation:: AROM and Pitocin  Complications: None Delivery Type: spontaneous vaginal delivery and vaginal birth after cesarean (VBAC) Anesthesia: epidural anesthesia Placenta: spontaneous To Pathology: No  Laceration: 1st degree and right periurethral Episiotomy: none  Prenatal Labs:  Blood type/Rh A positive  Antibody screen neg  Rubella Immune  Varicella Non-Immune  RPR NR  HBsAg Neg  HIV NR  GC neg  Chlamydia neg  Genetic screening negative  1 hour GTT 113  3 hour GTT    GBS positive    Hospital course: Onset of Labor With Vaginal Delivery      23 y.o. yo [id number] at [redacted]w[redacted]d was admitted in Latent Labor on 03/04/2024. Labor course was uncomplicated. She progressed to 10/100/+2 and pushed for about , delivering viable female infant over 1st degree laceration. Apgars 9, 10. Membrane Rupture Time/Date: 10:30 PM,03/04/2024  Delivery Method:VBAC, Spontaneous Operative Delivery:N/A Episiotomy: None Lacerations:  1st  degree;Perineal Patient had a postpartum course complicated by none.  She is ambulating, tolerating a regular diet, passing flatus, and urinating well. Patient is discharged home in stable condition on 03/06/24.  Newborn Data: Jo Baker Birth date:03/05/2024 Birth time:5:08 AM Gender:Female Living status:Living Apgars:9 ,10  Weight:3790 g  Magnesium Sulfate received: No BMZ received: No Rhophylac:No MMR:No Varivax vaccine given: given PP T-DaP:Given prenatally Flu: No  Transfusion:No  Physical exam  Vitals:   03/05/24 1554 03/05/24 1911 03/05/24 2359 03/06/24 0851  BP: 110/69 (!) 103/57 (!) 93/56 101/70  Pulse: 81 86 75 78  Resp: 18 18 18 19   Temp: 97.6 F (36.4 C) 98 F (36.7 C) 97.7 F (36.5 C) 97.7 F (36.5 C)  TempSrc: Oral Oral Oral Oral  SpO2:  99% 98% 100%  Weight:      Height:       General: alert, cooperative, and no distress Lochia: appropriate Uterine Fundus: firm Perineum:minimal edema/repair well approximated Incision: n/a DVT Evaluation: No evidence of DVT seen on physical exam.  Labs: Lab Results  Component Value Date   WBC 11.9 (H) 03/06/2024   HGB 10.0 (L) 03/06/2024   HCT 30.8 (L) 03/06/2024   MCV 87.3 03/06/2024   PLT 224 03/06/2024      Latest Ref Rng & Units 02/26/2023    1:45 PM  CMP  Glucose 70 - 99 mg/dL 72   BUN 6 - 20 mg/dL 13   Creatinine 9.42 - 1.00 mg/dL 9.16   Sodium 865 - 855 mmol/L 139   Potassium 3.5 - 5.2 mmol/L  4.3   Chloride 96 - 106 mmol/L 100   CO2 20 - 29 mmol/L 25   Calcium  8.7 - 10.2 mg/dL 89.9   Total Protein 6.0 - 8.5 g/dL 7.1   Total Bilirubin 0.0 - 1.2 mg/dL 0.4   Alkaline Phos 44 - 121 IU/L 105   AST 0 - 40 IU/L 19   ALT 0 - 32 IU/L 15    Edinburgh Score:     No data to display          Risk assessment for postpartum VTE and prophylactic treatment: Very high risk factors: None High risk factors: None Moderate risk factors: BMI 30-40 kg/m2  Postpartum VTE prophylaxis with LMWH not  indicated  After visit meds:  Allergies as of 03/06/2024   No Known Allergies      Medication List     STOP taking these medications    famotidine  20 MG tablet Commonly known as: PEPCID    hydrOXYzine  25 MG tablet Commonly known as: ATARAX    ondansetron  4 MG disintegrating tablet Commonly known as: ZOFRAN -ODT       TAKE these medications    albuterol  108 (90 Base) MCG/ACT inhaler Commonly known as: VENTOLIN  HFA Inhale 2 puffs into the lungs every 6 (six) hours as needed for wheezing or shortness of breath.   buPROPion  300 MG 24 hr tablet Commonly known as: WELLBUTRIN  XL Take 300 mg by mouth daily.   multivitamin-prenatal 27-0.8 MG Tabs tablet Take 1 tablet by mouth daily at 12 noon.       Discharge home in stable condition Infant Feeding: Bottle and Breast Infant Disposition:home with mother Discharge instruction: per After Visit Summary and Postpartum booklet. Activity: Advance as tolerated. Pelvic rest for 6 weeks.  Diet: routine diet Anticipated Birth Control: IUD, Mirena Postpartum Appointment:6 weeks Additional Postpartum F/U: Postpartum Depression checkup Future Appointments:No future appointments. Follow up Visit:  Follow-up Information     Jo Baker, CNM Follow up in 6 week(s).   Specialty: Certified Nurse Midwife Why: 6wk postpartum and IUD placement Contact information: [address] [place] [address] [phone]         [facility] Follow up in 2 week(s).   Why: 2wk mood check Contact information: [address] [place]  [address] [phone]                Plan:  Jo Baker was discharged to home in good condition. Follow-up appointment as directed.    Signed:  Jenifer E Angelyn Baker, CNM 03/06/2024 10:45 AM

## 2024-03-05 NOTE — Lactation Note (Addendum)
 This note was copied from a baby's chart. 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 LATCH Score Latch:  (I did not observe a feeding)                  Lactation Tools Discussed/Used Tools: Pump Breast pump type: Double-Electric Breast Pump Reason for Pumping: mom request Pumping frequency: prn  Interventions Interventions: DEBP LC name and no written on white board Discharge Pump: Personal WIC Program: No Ura has a Spectra  breast pump at home Consult Status Consult Status: PRN    Aldona JONETTA Converse 03/05/2024, 3:58 PM

## 2024-03-06 DIAGNOSIS — F419 Anxiety disorder, unspecified: Secondary | ICD-10-CM | POA: Insufficient documentation

## 2024-03-06 DIAGNOSIS — K219 Gastro-esophageal reflux disease without esophagitis: Secondary | ICD-10-CM | POA: Insufficient documentation

## 2024-03-06 DIAGNOSIS — O2662 Liver and biliary tract disorders in childbirth: Secondary | ICD-10-CM | POA: Insufficient documentation

## 2024-03-06 LAB — CBC
HCT: 30.8 % — ABNORMAL LOW (ref 36.0–46.0)
Hemoglobin: 10 g/dL — ABNORMAL LOW (ref 12.0–15.0)
MCH: 28.3 pg (ref 26.0–34.0)
MCHC: 32.5 g/dL (ref 30.0–36.0)
MCV: 87.3 fL (ref 80.0–100.0)
Platelets: 224 10*3/uL (ref 150–400)
RBC: 3.53 MIL/uL — ABNORMAL LOW (ref 3.87–5.11)
RDW: 13.3 % (ref 11.5–15.5)
WBC: 11.9 10*3/uL — ABNORMAL HIGH (ref 4.0–10.5)
nRBC: 0 % (ref 0.0–0.2)

## 2024-03-06 MED ORDER — VARICELLA VIRUS VACCINE LIVE 1350 PFU/0.5ML IJ SUSR
0.5000 mL | INTRAMUSCULAR | Status: AC | PRN
  Administered 2024-03-06: 0.5 mL via SUBCUTANEOUS
  Filled 2024-03-06: qty 0.5

## 2024-03-06 NOTE — Anesthesia Postprocedure Evaluation (Signed)
 Anesthesia Post Note  Patient: Jo Baker  Procedure(s) Performed: AN AD HOC LABOR EPIDURAL  Patient location during evaluation: Mother Baby Anesthesia Type: Epidural Level of consciousness: awake and alert Pain management: pain level controlled Vital Signs Assessment: post-procedure vital signs reviewed and stable Respiratory status: spontaneous breathing, nonlabored ventilation and respiratory function stable Cardiovascular status: stable Postop Assessment: no headache, no backache, no apparent nausea or vomiting and able to ambulate Anesthetic complications: no   No notable events documented.   Last Vitals:  Vitals:   03/05/24 2359 03/06/24 0851  BP: (!) 93/56 101/70  Pulse: 75 78  Resp: 18 19  Temp: 36.5 C 36.5 C  SpO2: 98% 100%    Last Pain:  Vitals:   03/06/24 0859  TempSrc:   PainSc: 4                  Camellia Merilee Louder

## 2024-03-06 NOTE — Lactation Note (Signed)
 This note was copied from a baby's chart. Lactation Consultation Note  Patient Name: Jo Baker Date: 03/06/2024 Age:25 hours Reason for consult: Follow-up assessment;Term;Breastfeeding assistance;Other (Comment) (Discharge Education)   Maternal Data Does the patient have breastfeeding experience prior to this delivery?: Yes  LC called into room to observe a feeding and assist patient.   Feeding Mother's Current Feeding Choice: Breast Milk and Formula  Infant at the breast upon entry into the room.  Wide open gape but not suckling. Infant very fussy.  Patient has excellent positioning of infant at the breast.  LC assisted patient with latching but infant is so fussy he doesn't latch.   LC attempted to entice infant with a little formula but this didn't help infant with latching.   LC encouraged patient to do some STS and retry feeding in about 20 minutes.  Patient verbalized understanding.  LATCH Score Latch: Repeated attempts needed to sustain latch, nipple held in mouth throughout feeding, stimulation needed to elicit sucking reflex.  Audible Swallowing: None  Type of Nipple: Everted at rest and after stimulation (short nipple)  Comfort (Breast/Nipple): Soft / non-tender  Hold (Positioning): No assistance needed to correctly position infant at breast.  LATCH Score: 7  Interventions Interventions: Assisted with latch;Breast compression;Adjust position;Support pillows;Position options;Education;CDC milk storage guidelines  Discharge Discharge Education: Engorgement and breast care;Warning signs for feeding baby;Outpatient recommendation  Education on engorgement prevention/treatment was discussed as well as breastmilk storage guidelines.  LC provided patient with a handout on breastmilk storage guidelines from [facility]. [facility] outpatient lactation services phone number written on the white board in the room.  Patient verbalized understanding.  LC also provided  education from the postpartum book about warning signs to look for in a poor feeding.    Consult Status Consult Status: Complete    Ricky RAMAN Sean Malinowski 03/06/2024, 11:56 AM

## 2024-03-06 NOTE — Lactation Note (Signed)
 This note was copied from a baby's chart. Lactation Consultation Note  Patient Name: Jo Baker Unijb'd Date: 03/06/2024 Age:25 hours Reason for consult: Follow-up assessment;Breastfeeding assistance;Other (Comment)   Maternal Data Does the patient have breastfeeding experience prior to this delivery?: Yes  LC called to room to assist patient w/ a feeding.  Patient emotional, stated that she was frustrated.  Infant very fussy upon entering room.    Feeding Mother's Current Feeding Choice: Breast Milk and Formula  LC attempted to assist patient with a feeding at the breast.  Again, infant very fussy and not wanting to latch.  Baby [name] looked for the breast, opened mouth wide but would not latch.  LC provided patient with a nipple shield to see if this would help.  Infant would latch but not suckle.    Infant was then given 5ml of formula and placed at breast.  Infant continued to be very fussy.    FOB later fed infant 10ml while LC assisted patient with pumping.   LATCH Score Latch: Repeated attempts needed to sustain latch, nipple held in mouth throughout feeding, stimulation needed to elicit sucking reflex.  Audible Swallowing: None  Type of Nipple: Everted at rest and after stimulation (short nipple)  Comfort (Breast/Nipple): Soft / non-tender  Hold (Positioning): No assistance needed to correctly position infant at breast.  LATCH Score: 7   Lactation Tools Discussed/Used Tools: Pump;Nipple Shields Nipple shield size: 20 Pump Education: Setup, frequency, and cleaning;Milk Storage  Interventions Interventions: Assisted with latch;Breast compression;Adjust position;Education  LC talked to family about different options when going home for helping infant with feeding.  Provided patient with encouragement and support.  Discharge Discharge Education: Engorgement and breast care;Warning signs for feeding baby;Outpatient recommendation  Consult Status Consult  Status: Complete    Ricky RAMAN Cyprian Gongaware 03/06/2024, 12:03 PM

## 2024-03-06 NOTE — Progress Notes (Signed)
 Discharge instructions reviewed with patient and significant other.  Questions answered and follow up care reviewed.  Printed copies given to patient for reference after discharge home.

## 2024-03-18 DIAGNOSIS — F410 Panic disorder [episodic paroxysmal anxiety] without agoraphobia: Secondary | ICD-10-CM | POA: Diagnosis not present

## 2024-03-18 DIAGNOSIS — F330 Major depressive disorder, recurrent, mild: Secondary | ICD-10-CM | POA: Diagnosis not present

## 2024-03-21 DIAGNOSIS — F53 Postpartum depression: Secondary | ICD-10-CM | POA: Diagnosis not present

## 2024-03-21 DIAGNOSIS — Z8759 Personal history of other complications of pregnancy, childbirth and the puerperium: Secondary | ICD-10-CM | POA: Diagnosis not present

## 2024-03-21 DIAGNOSIS — Z8659 Personal history of other mental and behavioral disorders: Secondary | ICD-10-CM | POA: Diagnosis not present

## 2024-04-13 DIAGNOSIS — Z30430 Encounter for insertion of intrauterine contraceptive device: Secondary | ICD-10-CM | POA: Diagnosis not present

## 2024-04-13 DIAGNOSIS — Z3202 Encounter for pregnancy test, result negative: Secondary | ICD-10-CM | POA: Diagnosis not present

## 2024-05-16 DIAGNOSIS — Z1339 Encounter for screening examination for other mental health and behavioral disorders: Secondary | ICD-10-CM | POA: Diagnosis not present

## 2024-05-16 DIAGNOSIS — F430 Acute stress reaction: Secondary | ICD-10-CM | POA: Diagnosis not present

## 2024-05-16 DIAGNOSIS — F331 Major depressive disorder, recurrent, moderate: Secondary | ICD-10-CM | POA: Diagnosis not present

## 2024-05-23 DIAGNOSIS — Z30431 Encounter for routine checking of intrauterine contraceptive device: Secondary | ICD-10-CM | POA: Diagnosis not present

## 2024-05-26 DIAGNOSIS — F430 Acute stress reaction: Secondary | ICD-10-CM | POA: Diagnosis not present

## 2024-05-26 DIAGNOSIS — F410 Panic disorder [episodic paroxysmal anxiety] without agoraphobia: Secondary | ICD-10-CM | POA: Diagnosis not present

## 2024-06-17 DIAGNOSIS — F330 Major depressive disorder, recurrent, mild: Secondary | ICD-10-CM | POA: Diagnosis not present

## 2024-06-17 DIAGNOSIS — F410 Panic disorder [episodic paroxysmal anxiety] without agoraphobia: Secondary | ICD-10-CM | POA: Diagnosis not present

## 2024-06-23 DIAGNOSIS — F410 Panic disorder [episodic paroxysmal anxiety] without agoraphobia: Secondary | ICD-10-CM | POA: Diagnosis not present

## 2024-06-23 DIAGNOSIS — F4311 Post-traumatic stress disorder, acute: Secondary | ICD-10-CM | POA: Diagnosis not present

## 2024-06-23 DIAGNOSIS — F330 Major depressive disorder, recurrent, mild: Secondary | ICD-10-CM | POA: Diagnosis not present

## 2024-07-21 DIAGNOSIS — F331 Major depressive disorder, recurrent, moderate: Secondary | ICD-10-CM | POA: Diagnosis not present

## 2024-09-02 NOTE — Lactation Note (Addendum)
 This note was copied from a baby's chart. Lactation Consultation Note  Patient Name: Jo Baker Date: 09/02/2024 Age:26 m.o.  Reason for consult: maternal request; breastfeeding assistance.   Maternal Data  This is mom's 2nd baby who is now 43 months old. Mom has been pumping and providing breastmilk in addition to formula supplement. Per mom she has been bottle feeding baby, and also attempts to see if baby will also breastfeed. Mom reports Jo will latch, take 2-3 sucks and detach from the breast. After a few attempts at the breast he then becomes non-agreeable to try again. Mom has tried several strategies such as offering baby to: latch in side-lying, latch when he is in a sleepy phase, first am feed when her breasts are full, in addition to trying a nipple shield to mimic bottle nipple Jo is used to. Jo also gets distracted during attempts as he is curious about what is going on around him.  Feeding  LC provided tips and strategies to encourage baby to maintain latch once latched. To keep baby interested and minimize his looking around the room with distraction mom wore a breastfeeding beaded necklace. Mom positioned and latched baby well however baby will take a few sucks, and is observed tasting/licking some breastmilk, however he would not sustain the latch. Mom with excellent technique, also attempted latch with use of nipple shield without success. Baby became fussy at the breast. Mom offered some milk by bottle and after a partial bottle feeding mom attempted again to latch the baby. Baby refused to latch. Breastfeeding attempt stopped.  Mom and LC discussed mom can continue to try with strategies provided if she desires to do so. Discussed with mom Jo appears to prefer his feeding with a slow flow bottle and he also appears to be keenly aware that the way he is used to feeding is different from when he latches to the breast with or without the shield.  Discussed with mom celebrating all her efforts in providing breastmilk to her baby as well as all the benefits Jo still receives despite not direct breastfeeding.  Lactation Tools Discussed/Used  Nipple shield  Interventions  Support and encouragement provided.  Discharge  Personal Hands free pump.  Consult Status  Complete Outpatient    Avelina DELENA Gaskins 09/02/2024, 3:02 PM

## 2024-09-09 ENCOUNTER — Other Ambulatory Visit: Payer: Self-pay | Admitting: Family Medicine

## 2024-09-09 DIAGNOSIS — R1084 Generalized abdominal pain: Secondary | ICD-10-CM
